# Patient Record
Sex: Female | Born: 1980 | Race: White | Hispanic: No | Marital: Married | Smoking: Never smoker
Health system: Southern US, Community
[De-identification: ages and names within clinical notes are randomized; demographics above are authoritative.]

## PROBLEM LIST (undated history)

## (undated) DIAGNOSIS — F32A Depression, unspecified: Secondary | ICD-10-CM

## (undated) DIAGNOSIS — R7309 Other abnormal glucose: Secondary | ICD-10-CM

## (undated) DIAGNOSIS — F329 Major depressive disorder, single episode, unspecified: Secondary | ICD-10-CM

## (undated) DIAGNOSIS — E669 Obesity, unspecified: Secondary | ICD-10-CM

## (undated) DIAGNOSIS — F419 Anxiety disorder, unspecified: Secondary | ICD-10-CM

## (undated) DIAGNOSIS — R569 Unspecified convulsions: Secondary | ICD-10-CM

## (undated) DIAGNOSIS — G4730 Sleep apnea, unspecified: Secondary | ICD-10-CM

## (undated) HISTORY — DX: Anxiety disorder, unspecified: F41.9

## (undated) HISTORY — DX: Major depressive disorder, single episode, unspecified: F32.9

## (undated) HISTORY — DX: Depression, unspecified: F32.A

## (undated) HISTORY — DX: Unspecified convulsions: R56.9

## (undated) HISTORY — DX: Obesity, unspecified: E66.9

## (undated) HISTORY — DX: Other abnormal glucose: R73.09

---

## 2006-11-12 ENCOUNTER — Ambulatory Visit: Payer: Self-pay

## 2007-04-26 ENCOUNTER — Observation Stay: Payer: Self-pay | Admitting: Obstetrics & Gynecology

## 2007-04-26 ENCOUNTER — Inpatient Hospital Stay: Payer: Self-pay | Admitting: Obstetrics & Gynecology

## 2011-04-10 ENCOUNTER — Ambulatory Visit: Payer: Self-pay | Admitting: Internal Medicine

## 2011-07-28 ENCOUNTER — Emergency Department: Payer: Self-pay | Admitting: Internal Medicine

## 2011-08-01 ENCOUNTER — Ambulatory Visit: Payer: Self-pay | Admitting: Gastroenterology

## 2012-05-18 ENCOUNTER — Ambulatory Visit: Payer: Self-pay | Admitting: Obstetrics and Gynecology

## 2012-05-28 ENCOUNTER — Ambulatory Visit: Payer: Self-pay | Admitting: Obstetrics and Gynecology

## 2012-06-22 DIAGNOSIS — O24419 Gestational diabetes mellitus in pregnancy, unspecified control: Secondary | ICD-10-CM | POA: Insufficient documentation

## 2012-06-28 ENCOUNTER — Ambulatory Visit: Payer: Self-pay | Admitting: Obstetrics and Gynecology

## 2012-07-02 ENCOUNTER — Encounter: Payer: Self-pay | Admitting: Obstetrics and Gynecology

## 2012-07-02 LAB — URINALYSIS, COMPLETE
Glucose,UR: NEGATIVE mg/dL (ref 0–75)
Ketone: NEGATIVE
Leukocyte Esterase: NEGATIVE
Nitrite: NEGATIVE
Protein: NEGATIVE
Specific Gravity: 1.004 (ref 1.003–1.030)
WBC UR: 1 /HPF (ref 0–5)

## 2012-07-15 ENCOUNTER — Emergency Department: Payer: Self-pay | Admitting: Emergency Medicine

## 2012-07-15 LAB — CBC
HGB: 11.4 g/dL — ABNORMAL LOW (ref 12.0–16.0)
MCH: 26.4 pg (ref 26.0–34.0)
MCV: 82 fL (ref 80–100)
Platelet: 273 10*3/uL (ref 150–440)
RBC: 4.33 10*6/uL (ref 3.80–5.20)
WBC: 11.5 10*3/uL — ABNORMAL HIGH (ref 3.6–11.0)

## 2012-07-15 LAB — BASIC METABOLIC PANEL
Anion Gap: 5 — ABNORMAL LOW (ref 7–16)
BUN: 7 mg/dL (ref 7–18)
Chloride: 107 mmol/L (ref 98–107)
Co2: 25 mmol/L (ref 21–32)
Osmolality: 271 (ref 275–301)
Potassium: 4.1 mmol/L (ref 3.5–5.1)

## 2012-07-15 LAB — URINALYSIS, COMPLETE
Blood: NEGATIVE
Glucose,UR: NEGATIVE mg/dL (ref 0–75)
Ketone: NEGATIVE
Nitrite: NEGATIVE
Protein: NEGATIVE
RBC,UR: <1 /HPF (ref 0–5)
Squamous Epithelial: <1
WBC UR: 1 /HPF (ref 0–5)

## 2012-07-16 ENCOUNTER — Encounter: Payer: Self-pay | Admitting: Obstetrics and Gynecology

## 2012-07-16 LAB — URINALYSIS, COMPLETE
Glucose,UR: NEGATIVE mg/dL (ref 0–75)
Ketone: NEGATIVE
Nitrite: NEGATIVE
Ph: 7 (ref 4.5–8.0)
Protein: 30
RBC,UR: 2 /HPF (ref 0–5)
Specific Gravity: 1.03 (ref 1.003–1.030)
WBC UR: 3 /HPF (ref 0–5)

## 2012-07-30 ENCOUNTER — Encounter: Payer: Self-pay | Admitting: Maternal & Fetal Medicine

## 2012-07-30 LAB — URINALYSIS, COMPLETE
Bacteria: NONE SEEN
Blood: NEGATIVE
Nitrite: NEGATIVE
Ph: 7 (ref 4.5–8.0)
Protein: NEGATIVE
Specific Gravity: 1.018 (ref 1.003–1.030)
WBC UR: 2 /HPF (ref 0–5)

## 2012-09-10 ENCOUNTER — Encounter: Payer: Self-pay | Admitting: Obstetrics & Gynecology

## 2012-09-10 LAB — URINALYSIS, COMPLETE
Bilirubin,UR: NEGATIVE
Glucose,UR: NEGATIVE mg/dL (ref 0–75)
Ph: 7 (ref 4.5–8.0)
Specific Gravity: 1.021 (ref 1.003–1.030)
WBC UR: 2 /HPF (ref 0–5)

## 2012-09-21 ENCOUNTER — Encounter: Payer: Self-pay | Admitting: Obstetrics & Gynecology

## 2012-09-22 DIAGNOSIS — O0990 Supervision of high risk pregnancy, unspecified, unspecified trimester: Secondary | ICD-10-CM | POA: Insufficient documentation

## 2012-09-28 ENCOUNTER — Encounter: Payer: Self-pay | Admitting: Obstetrics and Gynecology

## 2012-10-01 ENCOUNTER — Encounter: Payer: Self-pay | Admitting: Obstetrics & Gynecology

## 2012-10-08 ENCOUNTER — Encounter: Payer: Self-pay | Admitting: Obstetrics and Gynecology

## 2012-10-09 DIAGNOSIS — A491 Streptococcal infection, unspecified site: Secondary | ICD-10-CM | POA: Insufficient documentation

## 2012-10-15 ENCOUNTER — Encounter: Payer: Self-pay | Admitting: Obstetrics & Gynecology

## 2012-10-19 ENCOUNTER — Encounter: Payer: Self-pay | Admitting: Obstetrics & Gynecology

## 2012-10-20 DIAGNOSIS — Z3009 Encounter for other general counseling and advice on contraception: Secondary | ICD-10-CM | POA: Insufficient documentation

## 2012-10-26 ENCOUNTER — Encounter: Payer: Self-pay | Admitting: Maternal & Fetal Medicine

## 2013-06-19 ENCOUNTER — Emergency Department: Payer: Self-pay | Admitting: Emergency Medicine

## 2013-06-19 LAB — LIPASE, BLOOD: Lipase: 151 U/L (ref 73–393)

## 2013-06-19 LAB — URINALYSIS, COMPLETE
Bacteria: NONE SEEN
Glucose,UR: NEGATIVE mg/dL (ref 0–75)
Nitrite: NEGATIVE
Ph: 6 (ref 4.5–8.0)
Protein: NEGATIVE
RBC,UR: 1 /HPF (ref 0–5)
Squamous Epithelial: 2

## 2013-06-19 LAB — CBC
HCT: 40.1 % (ref 35.0–47.0)
HGB: 13.5 g/dL (ref 12.0–16.0)
MCV: 78 fL — ABNORMAL LOW (ref 80–100)
Platelet: 284 10*3/uL (ref 150–440)
RDW: 16.4 % — ABNORMAL HIGH (ref 11.5–14.5)
WBC: 9.4 10*3/uL (ref 3.6–11.0)

## 2013-06-19 LAB — COMPREHENSIVE METABOLIC PANEL
Anion Gap: 4 — ABNORMAL LOW (ref 7–16)
Calcium, Total: 9 mg/dL (ref 8.5–10.1)
Chloride: 106 mmol/L (ref 98–107)
Co2: 27 mmol/L (ref 21–32)
Creatinine: 1.01 mg/dL (ref 0.60–1.30)
Glucose: 98 mg/dL (ref 65–99)
Potassium: 3.7 mmol/L (ref 3.5–5.1)
SGOT(AST): 27 U/L (ref 15–37)
SGPT (ALT): 49 U/L (ref 12–78)
Sodium: 137 mmol/L (ref 136–145)

## 2014-02-23 ENCOUNTER — Ambulatory Visit: Payer: Self-pay

## 2014-07-20 ENCOUNTER — Emergency Department: Payer: Self-pay | Admitting: Emergency Medicine

## 2014-07-20 LAB — URINALYSIS, COMPLETE
BILIRUBIN, UR: NEGATIVE
Blood: NEGATIVE
GLUCOSE, UR: NEGATIVE mg/dL (ref 0–75)
Ketone: NEGATIVE
Nitrite: NEGATIVE
PH: 5 (ref 4.5–8.0)
Protein: NEGATIVE
RBC,UR: 3 /HPF (ref 0–5)
SPECIFIC GRAVITY: 1.018 (ref 1.003–1.030)

## 2014-07-20 LAB — CBC WITH DIFFERENTIAL/PLATELET
Basophil #: 0 10*3/uL (ref 0.0–0.1)
Basophil %: 0.5 %
Eosinophil #: 0.2 10*3/uL (ref 0.0–0.7)
Eosinophil %: 2.7 %
HCT: 38.8 % (ref 35.0–47.0)
HGB: 12.3 g/dL (ref 12.0–16.0)
Lymphocyte #: 2.3 10*3/uL (ref 1.0–3.6)
Lymphocyte %: 25 %
MCH: 25.3 pg — AB (ref 26.0–34.0)
MCHC: 31.8 g/dL — ABNORMAL LOW (ref 32.0–36.0)
MCV: 79 fL — AB (ref 80–100)
Monocyte #: 0.5 x10 3/mm (ref 0.2–0.9)
Monocyte %: 5.9 %
NEUTROS ABS: 6 10*3/uL (ref 1.4–6.5)
Neutrophil %: 65.9 %
PLATELETS: 290 10*3/uL (ref 150–440)
RBC: 4.89 10*6/uL (ref 3.80–5.20)
RDW: 16.3 % — AB (ref 11.5–14.5)
WBC: 9 10*3/uL (ref 3.6–11.0)

## 2014-07-20 LAB — COMPREHENSIVE METABOLIC PANEL
ALK PHOS: 69 U/L
ALT: 48 U/L
Albumin: 3.2 g/dL — ABNORMAL LOW (ref 3.4–5.0)
Anion Gap: 6 — ABNORMAL LOW (ref 7–16)
BUN: 16 mg/dL (ref 7–18)
Bilirubin,Total: 0.2 mg/dL (ref 0.2–1.0)
Calcium, Total: 8.2 mg/dL — ABNORMAL LOW (ref 8.5–10.1)
Chloride: 107 mmol/L (ref 98–107)
Co2: 27 mmol/L (ref 21–32)
Creatinine: 1.13 mg/dL (ref 0.60–1.30)
EGFR (African American): >60
EGFR (Non-African Amer.): 59 — ABNORMAL LOW
GLUCOSE: 144 mg/dL — AB (ref 65–99)
Osmolality: 283 (ref 275–301)
Potassium: 4.1 mmol/L (ref 3.5–5.1)
SGOT(AST): 30 U/L (ref 15–37)
Sodium: 140 mmol/L (ref 136–145)
Total Protein: 7.5 g/dL (ref 6.4–8.2)

## 2014-07-20 LAB — LIPASE, BLOOD: LIPASE: 139 U/L (ref 73–393)

## 2015-01-27 ENCOUNTER — Ambulatory Visit
Admit: 2015-01-27 | Disposition: A | Discharge status: Home / Self Care | Payer: Self-pay | Attending: Specialist | Admitting: Specialist

## 2015-02-14 NOTE — Consult Note (Signed)
Referral Information:   Reason for Referral Gest Diabetes Increased BMI, plans [facility]    Referring Physician [facility]    Prenatal Hx 34 yo G3 p0111 at 20 3/7 weeks LMP unsure dated by 04/10/2012 u/s giving an EDC of 11/13/2012  following problems - abnormal early GCT 195 - Gest diabetes seen at lifestyle center [facility] , hgb A1c 5.6 -increased BMI 51- she has had weight loss of about 24 lbs with diet modification -prior delivery at 35 weeks    Past Obstetrical Hx 2008 spontaneous vaginal delivery at 35 weeks- pt thinks she was 37 weeks , no neonatal issues except increased bili , 7 lbs female  Sab   Home Medications: Medication Instructions Status  Bayer Aspirin  81 milligram(s) orally once a day Active  Flintstone chewable gummy 1  orally once a day Active   Allergies:   PCN: Rash  Vital Signs/Notes:  Nursing Vital Signs: **Vital Signs.:   05-Sep-13 13:10   Vital Signs Type Routine   Temperature Temperature (F) 97.4   Celsius 36.3   Temperature Source oral   Pulse Pulse 94   Pulse source if not from Vital Sign Device dinamap   Respirations Respirations 16   Systolic BP Systolic BP 119   Diastolic BP (mmHg) Diastolic BP (mmHg) 61   Mean BP 80   BP Source  if not from Vital Sign Device dinamap   Perinatal Consult:   LMP 03-Feb-2012    Past Medical History cont'd elevated BMI - [facility]    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   Review Of Systems:   Tolerating Diet Yes  hungry     Medications/Allergies Reviewed Medications/Allergies reviewed      Routine UA:  05-Sep-13 12:25    Color (UA) Straw   Clarity (UA) Clear   Glucose (UA) Negative   Bilirubin (UA) Negative   Ketones (UA) Negative   Specific Gravity (UA) 1.004   Blood (UA) Negative   Protein (UA) Negative   Nitrite (UA) Negative   Leukocyte Esterase (UA) Negative (Result(s) reported on 02 Jul 2012 at 01:30PM.)   RBC (UA) 1 /HPF   WBC (UA) 1 /HPF   Bacteria  (UA) TRACE   Epithelial Cells (UA) 1 /HPF (Result(s) reported on 02 Jul 2012 at 01:30PM.)     Additional Lab/Radiology Notes FBS 87-124 (7/8 >100) 2h breakfast 85-142 (4/8 >120) 2 h lunch 86-170 (3/6 >120) 2 h supper126-185 (4/4 >120)   Impression/Recommendations:   Impression IUP at 20 3/7 weeks - [facility] today  Gestational diabetes elevated sugars  Increased BMI weight loss    Recommendations start glyburide 2.5 bid  continue diet may need additional calories if weight loss continues  f/u 1 week to review sugars - pt would like to fax to avoid missing work- if improving reasonable to do f/u growth in 4 weeks   Plan:   Ultrasound at what gestational ages Monthly > 28 weeks    Antepartum Testing Twice weekly, Starting at 32 weeks    Delivery Mode Vaginal    Delivery at what gestational age [redacted] weeks     Total Time Spent with Patient 15 minutes    >50% of visit spent in couseling/coordination of care yes    Office Use Only 99241  Level 1 (15min) NEW office consult prob focused   Coding Description: MATERNAL CONDITIONS/HISTORY INDICATION(S).   Diabetes - Gestational GDM.  Electronic Signatures: Jimmey RalphLivingston, Tami Barren  Neysa Bonito (MD)  (Signed 05-Sep-13 14:34)  Authored: Referral, Home Medications, Allergies, Vital Signs/Notes, Consult, Exam, Lab, Lab/Radiology Notes, Impression, Plan, Billing, Coding Description   Last Updated: 05-Sep-13 14:34 by Rondall Allegra (MD)

## 2015-02-14 NOTE — Consult Note (Signed)
Referral Information:   Reason for Referral Gest Diabetes Increased BMI, plans [facility]    Referring Physician [facility] ([facility]    Prenatal Hx 34 yo G3 p0111 at 22 3/7 weeks LMP unsure dated by 04/10/2012 u/s giving an EDC of 11/13/2012  following problems - Gestational diabetes-abnormal early GCT 195 - [facility] on 1800 KCal ADA diet  , hgb A1c 5.6- started glyburide last visit 2.5 bid - one low  -increased BMI 51- she has had weight loss of about 24 lbs with diet modification- stable now  -prior delivery at 35 weeks -seen in ER yesterday for back spasm    Past Obstetrical Hx 2008 spontaneous vaginal delivery at 35 weeks- pt thinks she was 37 weeks , no neonatal issues except increased bili , 7 lbs female  Sab   Home Medications:  Medication Instructions Status  glyBURIDE 2.5 mg oral tablet 1 tab(s) orally 2 times a day Active  aspirin 81 mg oral tablet 1 tab(s) orally once a day Active   Allergies:   PCN: Rash  Vital Signs/Notes:  Nursing Vital Signs:  **Vital Signs.:   19-Sep-13 14:29   Vital Signs Type Routine   Pulse Pulse 105   Pulse source if not from Vital Sign Device dinamap   Respirations Respirations 14   Systolic BP Systolic BP 112   Diastolic BP (mmHg) Diastolic BP (mmHg) 57   Mean BP 75   BP Source  if not from Vital Sign Device dinamap   Perinatal Consult:   LMP 03-Feb-2012    Past Medical History cont'd elevated BMI - [id number]    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   Review Of Systems:   Subjective back pain/spasm- no clear injury  recent butock rash spontaneously resolved    Tolerating Diet Yes  hungry    Medications/Allergies Reviewed Medications/Allergies reviewed   Exam:   Today's Weight 326     Routine UA:  19-Sep-13 08:01    Color (UA) Yellow   Clarity (UA) Hazy   Glucose (UA) Negative   Bilirubin (UA) 1+   Ketones (UA) Negative   Specific Gravity (UA)  1.030   Blood (UA) Negative   pH (UA) 7.0   Protein (UA) 30 mg/dL   Nitrite (UA) Negative   Leukocyte Esterase (UA) Trace (Result(s) reported on 16 Jul 2012 at 02:28PM.)   RBC (UA) 2 /HPF   WBC (UA) 3 /HPF   Bacteria (UA) TRACE   Epithelial Cells (UA) 3 /HPF   Mucous (UA) PRESENT (Result(s) reported on 16 Jul 2012 at 02:28PM.)     Additional Lab/Radiology Notes FBS 98-171 (4/7 >100) 171 may be a rebound from low 2h breakfast 93-146 (2/7 >120) 2 h lunch 93-106 (0/4 >120) 2 h supper 58-166 (2/4 >120)   Impression/Recommendations:   Impression IUP at 22 3/7 weeks - [facility]  Gestational diabetes recently started glyburide Increased BMI weight loss Back spasms - [facility] no evidence of stone or infection buttock rash resolved    Recommendations increase  glyburide 5/ 2.5  if more lows drop back to 2.5 continue diet may need additional calories if weight loss continues  f/u 2 week to review sugars - pt would like to fax to avoid missing work- if improving reasonable to do f/u growth in 10/3 Rx flexeril for back at night tylenol ok during day- offered PT  Plan:   Ultrasound at what gestational ages Monthly > 28 weeks    Antepartum Testing Twice weekly, Starting at 17 weeks    Delivery Mode Vaginal    Delivery at what gestational age [redacted] weeks     Total Time Spent with Patient 15 minutes    >50% of visit spent in couseling/coordination of care yes    Office Use Only [id number]  Office Visit Level 3 ( ) EST exp prob focused outpt   Coding Description: MATERNAL CONDITIONS/HISTORY INDICATION(S).   Diabetes - Gestational GDM.  Electronic Signatures: Rondall Allegra (MD)  (Signed 19-Sep-13 15:56)  Authored: Referral, Home Medications, Allergies, Vital Signs/Notes, Consult, Exam, Lab, Lab/Radiology Notes, Impression, Plan, Billing, Coding Description   Last Updated: 19-Sep-13 15:56 by Rondall Allegra (MD)

## 2015-02-14 NOTE — Consult Note (Signed)
Referral Information:   Reason for Referral Review sugar log    Referring Physician [facility]    Prenatal Hx Audrey Wolfe is a 34 year-old G3 P0111 at 28 3/7 weeks with gestational diabetes who presents to review her sugar log. She is receiving her prenanatal care with our group in [place]. She is taking glyburide 2.5 mg twice daily.  She did not bring in her log or her meter. She states that her fasting and 2hr post-prandial values are all "normal". She reports excellent fetal movement and denies leakage of fluid, contractions, pressure or vaginal bleeding.   She had a growth ultrasound at the [facility] at [facility] on 10/29/11 that demonstrated an EFW of 1615 g (90%) with the [facility] measuring one week ahead and the AFI 19.7 (MVP 6.9).  This am she had an apple and 3 mini muffins. Her BG here was 130 less than 2 hours since eating.   Home Medications: Medication Instructions Status  glyBURIDE 2.5 mg oral tablet 1 tab(s) orally 2 times a day Active  aspirin 81 mg oral tablet 1 tab(s) orally once a day Active   Allergies:   PCN: Rash  Vital Signs/Notes:  Nursing Vital Signs: **Vital Signs.:   14-Nov-13 08:56   Systolic BP Systolic BP 119   Diastolic BP (mmHg) Diastolic BP (mmHg) 60     Routine UA:  05-Sep-13 12:25    Ketones (UA) Negative   Protein (UA) Negative  19-Sep-13 08:01    Ketones (UA) Negative   Protein (UA) 30 mg/dL  29-FAO-13 08:65    Ketones (UA) Trace   Protein (UA) Negative  14-Nov-13 09:32    Color (UA) Yellow   Clarity (UA) Hazy   Glucose (UA) Negative   Ketones (UA) Trace   Specific Gravity (UA) 1.021   Blood (UA) Negative   pH (UA) 7.0   Protein (UA) Negative   Nitrite (UA) Negative   Leukocyte Esterase (UA) Trace (Result(s) reported on 10 Sep 2012 at 10:04AM.)   RBC (UA) 3 /HPF   WBC (UA) 2 /HPF   Bacteria (UA) TRACE   Epithelial Cells (UA) 1 /HPF   Mucous (UA) PRESENT (Result(s) reported on 10 Sep 2012 at 10:04AM.)    Impression/Recommendations:   Impression 34 year-old G3 P0111 at 63 3/7 weeks with GDM on glyburide. She is currently taking 2.5 mg twice daily. There is no glucose in her urine and her BP is normal. She reports excellent fetal movement.    Recommendations I explained the importance of following her diet, taking her medications and recording sugars. I explained that without bringing her sugar log or meter that we are unable to make adjustments or determine if her control is good.  She has trasnferred her care to [facility] and is planning on delivering at [facility]. I explained that we can do her ultrasounds and fetal testing in the [place] office but need do her prenatal visits in our [facility] office.  I scheduled the following appts for her: -Prenatal visit with Dr. Idamae Schuller, Friday Nov 22 8:10 am at [facility] -NST/AFI at [facility] in [place] on Friday Nov 22 -NST at [facility] on Monday Nov 25 -Growth scan in [place] on Dec 5.  We discussed preterm labor precautions, blood sugar values in which to contact us and kick counts. I answered all of her questions.     Total Time Spent with Patient 15 minutes    >50% of visit spent in couseling/coordination of care yes  Office Use Only [id number]  Office Visit Level 3 (15min) EST exp prob focused outpt   Coding Description: MATERNAL CONDITIONS/HISTORY INDICATION(S).   Diabetes - Gestational GDM.  Electronic Signatures: Jumaane Weatherford, [place] (MD)  (Signed [phone]-Nov-13 12:16)  Authored: Referral, Home Medications, Allergies, Vital Signs/Notes, Lab, Impression, Billing, Coding Description   Last Updated: 14-Nov-13 12:16 by Ramsha Lonigro, [place] (MD)

## 2015-05-11 ENCOUNTER — Ambulatory Visit (INDEPENDENT_AMBULATORY_CARE_PROVIDER_SITE_OTHER): Payer: 59

## 2015-05-11 DIAGNOSIS — Z308 Encounter for other contraceptive management: Secondary | ICD-10-CM | POA: Diagnosis not present

## 2015-05-11 DIAGNOSIS — Z3042 Encounter for surveillance of injectable contraceptive: Secondary | ICD-10-CM

## 2015-05-11 MED ORDER — MEDROXYPROGESTERONE ACETATE 150 MG/ML IM SUSP
150.0000 mg | Freq: Once | INTRAMUSCULAR | Status: AC
  Administered 2015-05-11: 150 mg via INTRAMUSCULAR

## 2015-08-10 ENCOUNTER — Ambulatory Visit (INDEPENDENT_AMBULATORY_CARE_PROVIDER_SITE_OTHER): Payer: 59

## 2015-08-10 DIAGNOSIS — Z308 Encounter for other contraceptive management: Secondary | ICD-10-CM | POA: Diagnosis not present

## 2015-08-10 MED ORDER — MEDROXYPROGESTERONE ACETATE 150 MG/ML IM SUSP
150.0000 mg | Freq: Once | INTRAMUSCULAR | Status: AC
  Administered 2015-08-10: 150 mg via INTRAMUSCULAR

## 2015-08-11 ENCOUNTER — Emergency Department
Admission: EM | Admit: 2015-08-11 | Discharge: 2015-08-11 | Disposition: A | Discharge status: Home / Self Care | Payer: 59 | Attending: Emergency Medicine | Admitting: Emergency Medicine

## 2015-08-11 ENCOUNTER — Encounter: Payer: Self-pay | Admitting: *Deleted

## 2015-08-11 ENCOUNTER — Emergency Department: Payer: 59

## 2015-08-11 DIAGNOSIS — Y998 Other external cause status: Secondary | ICD-10-CM | POA: Insufficient documentation

## 2015-08-11 DIAGNOSIS — Z880 Allergy status to penicillin: Secondary | ICD-10-CM | POA: Diagnosis not present

## 2015-08-11 DIAGNOSIS — X58XXXA Exposure to other specified factors, initial encounter: Secondary | ICD-10-CM | POA: Insufficient documentation

## 2015-08-11 DIAGNOSIS — Y9289 Other specified places as the place of occurrence of the external cause: Secondary | ICD-10-CM | POA: Insufficient documentation

## 2015-08-11 DIAGNOSIS — S8992XA Unspecified injury of left lower leg, initial encounter: Secondary | ICD-10-CM | POA: Diagnosis not present

## 2015-08-11 DIAGNOSIS — M25562 Pain in left knee: Secondary | ICD-10-CM

## 2015-08-11 DIAGNOSIS — Y9301 Activity, walking, marching and hiking: Secondary | ICD-10-CM | POA: Insufficient documentation

## 2015-08-11 MED ORDER — KETOROLAC TROMETHAMINE 60 MG/2ML IM SOLN
INTRAMUSCULAR | Status: AC
  Administered 2015-08-11: 60 mg
  Filled 2015-08-11: qty 2

## 2015-08-11 MED ORDER — OXYCODONE-ACETAMINOPHEN 5-325 MG PO TABS: 1.0000 | ORAL_TABLET | Freq: Four times a day (QID) | ORAL | Status: DC | PRN

## 2015-08-11 MED ORDER — ACETAMINOPHEN 500 MG PO TABS
1000.0000 mg | ORAL_TABLET | Freq: Once | ORAL | Status: AC
  Administered 2015-08-11: 1000 mg via ORAL

## 2015-08-11 MED ORDER — IBUPROFEN 800 MG PO TABS: 800.0000 mg | ORAL_TABLET | Freq: Three times a day (TID) | ORAL | Status: DC | PRN

## 2015-08-11 MED ORDER — ACETAMINOPHEN 500 MG PO TABS
ORAL_TABLET | ORAL | Status: AC
  Filled 2015-08-11: qty 2

## 2015-08-11 MED ORDER — KETOROLAC TROMETHAMINE 30 MG/ML IJ SOLN: 60.0000 mg | Freq: Once | INTRAMUSCULAR | Status: AC

## 2015-08-11 NOTE — ED Notes (Signed)
Pt walking today and heard pop in left knee.

## 2015-08-11 NOTE — ED Notes (Signed)
Pt presents with left knee pain. States she was walking and heard a pop. Denies fall. Denies ankle or hip pain.

## 2015-08-11 NOTE — Discharge Instructions (Signed)
Today you do not have a fracture or dislocation. It is still possible that you may have other injury to your knee, such as tendon, ligament, or meniscus injury. Please use the knee immobilizer as needed to support the knee when you're walking. Please make sure the remove the knee immobilizer at least once an hour and fully range your knee to prevent soreness and stiffness. You may take Motrin for mild to moderate pain, and Percocet for severe pain. Do not drive within 8 hours of taking Percocet or take care of your children by yourself when you're taking Percocet.  Please return to the emergency department if you develop severe pain, numbness tingling or weakness, fever, vomiting, or any other symptoms concerning to you.  How to Use a Knee Brace A knee brace is a device that you wear to support your knee, especially if the knee is healing after an injury or surgery. There are several types of knee braces. Some are designed to prevent an injury (prophylactic brace). These are often worn during sports. Others support an injured knee (functional brace) or keep it still while it heals (rehabilitative brace). People with severe arthritis of the knee may benefit from a brace that takes some pressure off the knee (unloader brace). Most knee braces are made from a combination of cloth and metal or plastic.  You may need to wear a knee brace to:  Relieve knee pain.  Help your knee support your weight (improve stability).  Help you walk farther (improve mobility).  Prevent injury.  Support your knee while it heals from surgery or from an injury. RISKS AND COMPLICATIONS Generally, knee braces are very safe to wear. However, problems may occur, including:  Skin irritation that may lead to infection.  Making your condition worse if you wear the brace in the wrong way. HOW TO USE A KNEE BRACE Different braces will have different instructions for use. Your health care provider will tell you or show  you:  How to put on your brace.  How to adjust the brace.  When and how often to wear the brace.  How to remove the brace.  If you will need any assistive devices in addition to the brace, such as crutches or a cane. In general, your brace should:  Have the hinge of the brace line up with the bend of your knee.  Have straps, hooks, or tapes that fasten snugly around your leg.  Not feel too tight or too loose. HOW TO CARE FOR A KNEE BRACE  Check your brace often for signs of damage, such as loose connections or attachments. Your knee brace may get damaged or wear out during normal use.  Wash the fabric parts of your brace with soap and water.  Read the insert that comes with your brace for other specific care instructions. SEEK MEDICAL CARE IF:  Your knee brace is too loose or too tight and you cannot adjust it.  Your knee brace causes skin redness, swelling, bruising, or irritation.  Your knee brace is not helping.  Your knee brace is making your knee pain worse.   This information is not intended to replace advice given to you by your health care provider. Make sure you discuss any questions you have with your health care provider.   Document Released: 01/04/2004 Document Revised: 07/05/2015 Document Reviewed: 02/06/2015 Elsevier Interactive Patient Education [employer]2016 Elsevier Inc.

## 2015-08-11 NOTE — ED Provider Notes (Signed)
[facility] Emergency Department Provider Note  ____________________________________________  Time seen: Approximately 9:24 PM  I have reviewed the triage vital signs and the nursing notes.   HISTORY  Chief Complaint Knee Pain    HPI AREEBA SULSER is a 34 y.o. female with morbid obesity presenting with left knee pain. Patient states that she was walking earlier today and "felt a pop." She did not fall and was able to continue bearing weight. She now feels like she needs to walk with a limp. Pain is worse if she bends her knee. She denies any fever, vaginal discharge, numbness tingling or weakness.   History reviewed. No pertinent past medical history.  There are no active problems to display for this patient.   History reviewed. No pertinent past surgical history.  Current Outpatient Rx  Name  Route  Sig  Dispense  Refill  . ibuprofen (ADVIL,MOTRIN) 800 MG tablet   Oral   Take 1 tablet (800 mg total) by mouth every 8 (eight) hours as needed for moderate pain (with food).   20 tablet   0   . oxyCODONE-acetaminophen (ROXICET) 5-325 MG tablet   Oral   Take 1 tablet by mouth every 6 (six) hours as needed for severe pain.   12 tablet   0     Allergies Penicillins  No family history on file.  Social History Social History  Substance Use Topics  . Smoking status: Never Smoker   . Smokeless tobacco: None  . Alcohol Use: No    Review of Systems Constitutional: No fever/chills. No syncope. Eyes: No visual changes. ENT: No sore throat. Cardiovascular: Denies chest pain, palpitations. Respiratory: Denies shortness of breath.  No cough. Gastrointestinal: No abdominal pain.  No nausea, no vomiting.  No diarrhea.  No constipation.  Musculoskeletal: Negative for back pain. Positive for left knee pain. Skin: Negative for rash. Neurological: Negative for headaches, focal weakness or numbness.  10-point ROS otherwise  negative.  ____________________________________________   PHYSICAL EXAM:  VITAL SIGNS: ED Triage Vitals  Enc Vitals Group     BP 08/11/15 2105 142/81 mmHg     Pulse Rate 08/11/15 2105 98     Resp 08/11/15 2105 16     Temp 08/11/15 2105 98.3 F (36.8 C)     Temp Source 08/11/15 2105 Oral     SpO2 08/11/15 2105 95 %     Weight 08/11/15 2105 365 lb (165.563 kg)     Height 08/11/15 2105  (1.702 m)     Head Cir --      Peak Flow --      Pain Score 08/11/15 2103 10     Pain Loc --      Pain Edu? --      Excl. in GC? --     Constitutional: Alert and oriented. Well appearing and in no acute distress. Answer question appropriately. Eyes: Conjunctivae are normal.  EOMI. Head: Atraumatic. Nose: No congestion/rhinnorhea. Mouth/Throat: Mucous membranes are moist.  Neck: No stridor.  Supple.   Cardiovascular: Normal rate, regular rhythm. No murmurs, rubs or gallops.  Respiratory: Normal respiratory effort.  No retractions. Lungs CTAB.  No wheezes, rales or ronchi. Musculoskeletal: No LE edema. Full range of motion of the left ankle and hip without pain. Patient has full range of motion of the left knee but pain when moving it. No obvious effusion. Patella is midline. Normal DP and PT pulse. Normal sensation to light touch. Normal cap refill of less than  2 seconds. Neurologic:  Normal speech and language. No gross focal neurologic deficits are appreciated.  Skin:  Skin is warm, dry and intact. No rash noted. Psychiatric: Mood and affect are normal. Speech and behavior are normal.  Normal judgement.  ____________________________________________   LABS (all labs ordered are listed, but only abnormal results are displayed)  Labs Reviewed - No data to display ____________________________________________  EKG  Not indicated ____________________________________________  RADIOLOGY  Dg Knee Complete 4 Views Left  08/11/2015  CLINICAL DATA:  Left knee pain EXAM: LEFT KNEE -  COMPLETE 4+ VIEW COMPARISON:  None. FINDINGS: Four views of the left knee submitted. No acute fracture or subluxation. Mild narrowing of medial joint compartment. Mild spurring of medial tibial plateau. Narrowing of patellofemoral joint space. Trace joint effusion. IMPRESSION: No acute fracture or subluxation.  Mild degenerative changes. Electronically Signed   By: Natasha MeadLiviu  Pop M.D.   On: 08/11/2015 21:44    ____________________________________________   PROCEDURES  Procedure(s) performed: None  Critical Care performed: No ____________________________________________   INITIAL IMPRESSION / ASSESSMENT AND PLAN / ED COURSE  Pertinent labs & imaging results that were available during my care of the patient were reviewed by me and considered in my medical decision making (see chart for details).  34 y.o. female with morbid obesity presenting with acute onset of left knee pain. On my exam, the patient is not showing evidence of dislocation. She does not have acute trauma so fracture is unlikely but I will get an x-ray to confirm. It is possible that she has ligamentous, tendon, or meniscal injury. Consider also ruptured Baker cyst. I do not see evidence of septic arthritis or gout in the knee.  ----------------------------------------- 10:05 PM on 08/11/2015 -----------------------------------------  The patient's knee x-ray does not show any acute injury. Her pain has improved. I will place a knee immobilizer on her, give her crutches and medications for symptomatic treatment as well as instructions for [place]. She will follow-up with orthopedist on-call. Plan discharge. She understands return precautions as well as follow-up instructions.  ____________________________________________  FINAL CLINICAL IMPRESSION(S) / ED DIAGNOSES  Final diagnoses:  Knee pain, left      NEW MEDICATIONS STARTED DURING THIS VISIT:  New Prescriptions   IBUPROFEN (ADVIL,MOTRIN) 800 MG TABLET    Take 1  tablet (800 mg total) by mouth every 8 (eight) hours as needed for moderate pain (with food).   OXYCODONE-ACETAMINOPHEN (ROXICET) 5-325 MG TABLET    Take 1 tablet by mouth every 6 (six) hours as needed for severe pain.     Rockne MenghiniAnne-Caroline Somalia Segler, MD 08/11/15 2205

## 2015-09-12 ENCOUNTER — Telehealth: Payer: Self-pay

## 2015-09-12 DIAGNOSIS — R7309 Other abnormal glucose: Secondary | ICD-10-CM | POA: Insufficient documentation

## 2015-09-12 DIAGNOSIS — F419 Anxiety disorder, unspecified: Principal | ICD-10-CM

## 2015-09-12 DIAGNOSIS — F329 Major depressive disorder, single episode, unspecified: Secondary | ICD-10-CM

## 2015-09-12 DIAGNOSIS — R569 Unspecified convulsions: Secondary | ICD-10-CM | POA: Insufficient documentation

## 2015-09-12 DIAGNOSIS — F32A Depression, unspecified: Secondary | ICD-10-CM | POA: Insufficient documentation

## 2015-09-12 NOTE — Telephone Encounter (Signed)
Called patient because we got a fax requesting office notes for the patient to get her cpap. Since we have not seen this patient for that, Elnita MaxwellCheryl stated that the patient needed an appointment. So I called and scheduled the patient an appointment for 09/18/15.

## 2015-09-18 ENCOUNTER — Ambulatory Visit: Payer: Self-pay | Admitting: Unknown Physician Specialty

## 2015-09-19 ENCOUNTER — Encounter: Payer: Self-pay | Admitting: Unknown Physician Specialty

## 2015-09-19 ENCOUNTER — Ambulatory Visit (INDEPENDENT_AMBULATORY_CARE_PROVIDER_SITE_OTHER): Payer: 59 | Admitting: Unknown Physician Specialty

## 2015-09-19 VITALS — BP 112/76 | HR 89 | Temp 98.4°F | Ht 65.5 in | Wt 360.4 lb

## 2015-09-19 DIAGNOSIS — G4730 Sleep apnea, unspecified: Secondary | ICD-10-CM | POA: Diagnosis not present

## 2015-09-19 DIAGNOSIS — E669 Obesity, unspecified: Secondary | ICD-10-CM | POA: Insufficient documentation

## 2015-09-19 NOTE — Progress Notes (Signed)
BP 112/76 mmHg  Pulse 89  Temp(Src) 98.4 F (36.9 C)  Ht 5' 5.5" (1.664 m)  Wt 360 lb 6.4 oz (163.476 kg)  BMI 59.04 kg/m2  SpO2 97%  LMP  (LMP Unknown)   Subjective:    Patient ID: Audrey Wolfe, female    DOB: [birth year], 34 y.o.   MRN: [id number]  HPI: Audrey Wolfe is a 34 y.o. female  Chief Complaint  Patient presents with  . other    got a fax requesting that we send a office visit note about sleep apnea for the patient    Planning on getting bariatric surgery. As part of the work-up, she got a sleep study.  She went to feeling great and diagnosed with sleep apnea.  She received a CPAP In October.  She states the settings is 12 with a nose/mask device.  Before the mask, she fell asleep during the day, snored at night, woke up with a headache.  No problems with BP.  Weight loss surgery is scheduled December 6th.  She has lost 14 pounds  Relevant past medical, surgical, family and social history reviewed and updated as indicated. Interim medical history since our last visit reviewed. Allergies and medications reviewed and updated.  Review of Systems  Per HPI unless specifically indicated above     Objective:    BP 112/76 mmHg  Pulse 89  Temp(Src) 98.4 F (36.9 C)  Ht 5' 5.5" (1.664 m)  Wt 360 lb 6.4 oz (163.476 kg)  BMI 59.04 kg/m2  SpO2 97%  LMP  (LMP Unknown)  Wt Readings from Last 3 Encounters:  09/19/15 360 lb 6.4 oz (163.476 kg)  05/20/14 354 lb (160.573 kg)  08/11/15 365 lb (165.563 kg)    Physical Exam  Constitutional: She is oriented to person, place, and time. She appears well-developed and well-nourished. No distress.  HENT:  Head: Normocephalic and atraumatic.  Eyes: Conjunctivae and lids are normal. Right eye exhibits no discharge. Left eye exhibits no discharge. No scleral icterus.  Cardiovascular: Normal rate, regular rhythm and normal heart sounds.   Pulmonary/Chest: Effort normal and breath sounds normal. No respiratory distress.   Abdominal: Normal appearance. There is no splenomegaly or hepatomegaly.  Musculoskeletal: Normal range of motion.  Neurological: She is alert and oriented to person, place, and time.  Skin: Skin is intact. No rash noted. No pallor.  Psychiatric: She has a normal mood and affect. Her behavior is normal. Judgment and thought content normal.    Results for orders placed or performed in visit on 07/20/14  Urinalysis, Complete  Result Value Ref Range   Color - urine Yellow    Clarity - urine Hazy    Glucose,UR Negative 0-75 mg/dL   Bilirubin,UR Negative NEGATIVE   Ketone Negative NEGATIVE   Specific Gravity 1.018 1.003-1.030   Blood Negative NEGATIVE   Ph 5.0 4.5-8.0   Protein Negative NEGATIVE   Nitrite Negative NEGATIVE   Leukocyte Esterase 1+ NEGATIVE   RBC,UR 3 /HPF 0-5 /HPF   WBC UR 17 /HPF 0-5 /HPF   Bacteria TRACE NONE SEEN   Squamous Epithelial 6 /HPF    Mucous PRESENT   CBC with Differential/Platelet  Result Value Ref Range   WBC 9.0 3.6-11.0 x10 3/mm 3   RBC 4.89 3.80-5.20 X10 6/mm 3   HGB 12.3 12.0-16.0 g/dL   HCT 38.8 35.0-47.0 %   MCV 79 (L) 80-100 fL   MCH 25.3 (L) 26.0-34.0 pg   MCHC 31.8 (L) 32.0-36.0 g/dL  RDW 16.3 (H) 11.5-14.5 %   Platelet 290 150-440 x10 3/mm 3   Neutrophil % 65.9 %   Lymphocyte % 25.0 %   Monocyte % 5.9 %   Eosinophil % 2.7 %   Basophil % 0.5 %   Neutrophil # 6.0 1.4-6.5 x10 3/mm 3   Lymphocyte # 2.3 1.0-3.6 x10 3/mm 3   Monocyte # 0.5 0.2-0.9 x10 3/mm    Eosinophil # 0.2 0.0-0.7 x10 3/mm 3   Basophil # 0.0 0.0-0.1 x10 3/mm 3  Comprehensive metabolic panel  Result Value Ref Range   Glucose 144 (H) 65-99 mg/dL   BUN 16 7-18 mg/dL   Creatinine 1.13 0.60-1.30 mg/dL   Sodium 140 136-145 mmol/L   Potassium 4.1 3.5-5.1 mmol/L   Chloride 107 98-107 mmol/L   Co2 27 21-32 mmol/L   Calcium, Total 8.2 (L) 8.5-10.1 mg/dL   SGOT(AST) 30 15-37 Unit/L   SGPT (ALT) 48 U/L   Alkaline Phosphatase 69 Unit/L   Albumin 3.2 (L) 3.4-5.0 g/dL    Total Protein 7.5 6.4-8.2 g/dL   Bilirubin,Total 0.2 0.2-1.0 mg/dL   Osmolality 283 275-301   Anion Gap 6 (L) 7-16   EGFR (African American) >60 >48m/min   EGFR (Non-African Amer.) 59 (L) >696mmin  Lipase, blood  Result Value Ref Range   Lipase 139 73-393 Unit/L      Assessment & Plan:   Problem List Items Addressed This Visit      Unprioritized   Sleep apnea - Primary   Extreme obesity ([place]      Follow up plan: Return if symptoms worsen or fail to improve.  Form will be sent to [facility] with this note but will need a note from the bariatric clinic who referred her for the sleep study

## 2015-09-25 ENCOUNTER — Encounter
Admission: RE | Admit: 2015-09-25 | Discharge: 2015-09-25 | Disposition: A | Discharge status: Home / Self Care | Payer: 59 | Source: Ambulatory Visit | Attending: Specialist | Admitting: Specialist

## 2015-09-25 DIAGNOSIS — Z01812 Encounter for preprocedural laboratory examination: Secondary | ICD-10-CM | POA: Diagnosis not present

## 2015-09-25 HISTORY — DX: Sleep apnea, unspecified: G47.30

## 2015-09-25 LAB — ABO/RH: ABO/RH(D): O POS

## 2015-09-25 LAB — TYPE AND SCREEN
ABO/RH(D): O POS
Antibody Screen: NEGATIVE

## 2015-09-25 NOTE — Patient Instructions (Signed)
  Your procedure is scheduled on: October 03, 2015 (Tuesday) Report to Day Surgery.[facility]) To find out your arrival time please call [phone] between 1PM - 3PM on October 02, 2015 (Monday).  Remember: Instructions that are not followed completely may result in serious medical risk, up to and including death, or upon the discretion of your surgeon and anesthesiologist your surgery may need to be rescheduled.    __x__ 1. Do not eat food or drink liquids after midnight. No gum chewing or hard candies.     ____ 2. No Alcohol for 24 hours before or after surgery.   ____ 3. Bring all medications with you on the day of surgery if instructed.    __x__ 4. Notify your doctor if there is any change in your medical condition     (cold, fever, infections).     Do not wear jewelry, make-up, hairpins, clips or nail polish.  Do not wear lotions, powders, or perfumes. You may wear deodorant.  Do not shave 48 hours prior to surgery. Men may shave face and neck.  Do not bring valuables to the hospital.    [facility] not [facility] for any belongings or valuables.               Contacts, dentures or bridgework may not be worn into surgery.  Leave your suitcase in the car. After surgery it may be brought to your room.  For patients admitted to the hospital, discharge time is determined by your                treatment team.   Patients discharged the day of surgery will not be allowed to drive home.   Please read over the following fact sheets that you were given:   Surgical Site Infection Prevention   ____ Take these medicines the morning of surgery with A SIP OF WATER:    1.   2.   3.   4.  5.  6.  ____ Fleet Enema (as directed)   _x___ Use CHG Soap as directed  ____ Use inhalers on the day of surgery  ____ Stop metformin 2 days prior to surgery    ____ Take 1/2 of usual insulin dose the night before surgery and none on the morning of surgery.   ____ Stop  Coumadin/Plavix/aspirin on   __x__ Stop Anti-inflammatories on (Tylenol ok to take for pain if needed)   ____ Stop supplements until after surgery.    __x__ Bring C-Pap to the hospital.

## 2015-10-03 ENCOUNTER — Inpatient Hospital Stay: Payer: 59 | Admitting: Anesthesiology

## 2015-10-03 ENCOUNTER — Inpatient Hospital Stay
Admission: RE | Admit: 2015-10-03 | Discharge: 2015-10-04 | DRG: 621 | Disposition: A | Discharge status: Home / Self Care | Payer: 59 | Source: Ambulatory Visit | Attending: Specialist | Admitting: Specialist

## 2015-10-03 ENCOUNTER — Encounter
Admission: RE | Disposition: A | Discharge status: Home / Self Care | Payer: Self-pay | Source: Ambulatory Visit | Attending: Specialist

## 2015-10-03 DIAGNOSIS — G4730 Sleep apnea, unspecified: Secondary | ICD-10-CM | POA: Diagnosis present

## 2015-10-03 DIAGNOSIS — F419 Anxiety disorder, unspecified: Secondary | ICD-10-CM | POA: Diagnosis present

## 2015-10-03 DIAGNOSIS — R569 Unspecified convulsions: Secondary | ICD-10-CM | POA: Diagnosis present

## 2015-10-03 DIAGNOSIS — F329 Major depressive disorder, single episode, unspecified: Secondary | ICD-10-CM | POA: Diagnosis present

## 2015-10-03 DIAGNOSIS — Z9884 Bariatric surgery status: Secondary | ICD-10-CM

## 2015-10-03 DIAGNOSIS — Z6841 Body Mass Index (BMI) 40.0 and over, adult: Secondary | ICD-10-CM | POA: Diagnosis not present

## 2015-10-03 HISTORY — PX: GASTRIC ROUX-EN-Y: SHX5262

## 2015-10-03 LAB — HEMOGLOBIN AND HEMATOCRIT, BLOOD
HCT: 38.6 % (ref 35.0–47.0)
Hemoglobin: 12.3 g/dL (ref 12.0–16.0)

## 2015-10-03 LAB — CREATININE, SERUM
Creatinine, Ser: 1.14 mg/dL — ABNORMAL HIGH (ref 0.44–1.00)
GFR calc Af Amer: >60 mL/min (ref >60)
GFR calc non Af Amer: >60 mL/min (ref >60)

## 2015-10-03 LAB — POCT PREGNANCY, URINE: PREG TEST UR: NEGATIVE

## 2015-10-03 SURGERY — LAPAROSCOPIC ROUX-EN-Y GASTRIC
Anesthesia: General

## 2015-10-03 MED ORDER — HYDROMORPHONE HCL 1 MG/ML IJ SOLN
INTRAMUSCULAR | Status: AC
  Administered 2015-10-03: 0.5 mg via INTRAVENOUS
  Filled 2015-10-03: qty 1

## 2015-10-03 MED ORDER — SUCCINYLCHOLINE CHLORIDE 20 MG/ML IJ SOLN
INTRAMUSCULAR | Status: DC | PRN
  Administered 2015-10-03: 140 mg via INTRAVENOUS

## 2015-10-03 MED ORDER — PREMIER PROTEIN SHAKE
2.0000 [oz_av] | Freq: Four times a day (QID) | ORAL | Status: DC
  Filled 2015-10-03: qty 325.31

## 2015-10-03 MED ORDER — LACTATED RINGERS IV SOLN
INTRAVENOUS | Status: DC
  Administered 2015-10-03 (×2): via INTRAVENOUS

## 2015-10-03 MED ORDER — DEXAMETHASONE SODIUM PHOSPHATE 4 MG/ML IJ SOLN
INTRAMUSCULAR | Status: DC | PRN
  Administered 2015-10-03: 10 mg via INTRAVENOUS
  Administered 2015-10-03: 8 mg via INTRAVENOUS

## 2015-10-03 MED ORDER — ACETAMINOPHEN 10 MG/ML IV SOLN
1000.0000 mg | Freq: Once | INTRAVENOUS | Status: AC
  Administered 2015-10-03: 1000 mg via INTRAVENOUS

## 2015-10-03 MED ORDER — DEXMEDETOMIDINE HCL 200 MCG/2ML IV SOLN
INTRAVENOUS | Status: DC | PRN
  Administered 2015-10-03: 8 ug via INTRAVENOUS

## 2015-10-03 MED ORDER — FENTANYL CITRATE (PF) 100 MCG/2ML IJ SOLN
INTRAMUSCULAR | Status: AC
  Administered 2015-10-03: 25 ug via INTRAVENOUS
  Filled 2015-10-03: qty 2

## 2015-10-03 MED ORDER — ONDANSETRON HCL 4 MG/2ML IJ SOLN
INTRAMUSCULAR | Status: DC | PRN
  Administered 2015-10-03: 4 mg via INTRAVENOUS

## 2015-10-03 MED ORDER — PROPOFOL 10 MG/ML IV BOLUS
INTRAVENOUS | Status: DC | PRN
  Administered 2015-10-03: 180 mg via INTRAVENOUS

## 2015-10-03 MED ORDER — FENTANYL CITRATE (PF) 100 MCG/2ML IJ SOLN
INTRAMUSCULAR | Status: DC | PRN
  Administered 2015-10-03 (×4): 50 ug via INTRAVENOUS
  Administered 2015-10-03: 100 ug via INTRAVENOUS
  Administered 2015-10-03 (×3): 50 ug via INTRAVENOUS

## 2015-10-03 MED ORDER — ONDANSETRON HCL 4 MG PO TABS: 4.0000 mg | ORAL_TABLET | ORAL | Status: DC | PRN

## 2015-10-03 MED ORDER — PROMETHAZINE HCL 25 MG/ML IJ SOLN: 12.5000 mg | Freq: Four times a day (QID) | INTRAMUSCULAR | Status: DC | PRN

## 2015-10-03 MED ORDER — LACTATED RINGERS IV SOLN
INTRAVENOUS | Status: DC
  Administered 2015-10-03: 13:00:00 via INTRAVENOUS

## 2015-10-03 MED ORDER — PROMETHAZINE HCL 25 MG/ML IJ SOLN
INTRAMUSCULAR | Status: AC
  Administered 2015-10-03: 6.25 mg via INTRAVENOUS
  Filled 2015-10-03: qty 1

## 2015-10-03 MED ORDER — ENOXAPARIN SODIUM 30 MG/0.3ML ~~LOC~~ SOLN
30.0000 mg | Freq: Two times a day (BID) | SUBCUTANEOUS | Status: DC
  Administered 2015-10-04: 30 mg via SUBCUTANEOUS
  Filled 2015-10-03: qty 0.3

## 2015-10-03 MED ORDER — SODIUM CHLORIDE 0.9 % IJ SOLN
INTRAMUSCULAR | Status: AC
  Filled 2015-10-03: qty 3

## 2015-10-03 MED ORDER — PANTOPRAZOLE SODIUM 40 MG IV SOLR
40.0000 mg | Freq: Every day | INTRAVENOUS | Status: DC
Start: 2015-10-03 — End: 2015-10-04
  Administered 2015-10-03: 40 mg via INTRAVENOUS
  Filled 2015-10-03: qty 40

## 2015-10-03 MED ORDER — SCOPOLAMINE 1 MG/3DAYS TD PT72
MEDICATED_PATCH | TRANSDERMAL | Status: AC
  Administered 2015-10-03: 1.5 mg via TRANSDERMAL
  Filled 2015-10-03: qty 1

## 2015-10-03 MED ORDER — CLINDAMYCIN PHOSPHATE 600 MG/50ML IV SOLN
600.0000 mg | Freq: Once | INTRAVENOUS | Status: AC
  Administered 2015-10-03: 600 mg via INTRAVENOUS

## 2015-10-03 MED ORDER — GENTAMICIN SULFATE 40 MG/ML IJ SOLN
1.5000 mg/kg | Freq: Once | INTRAVENOUS | Status: AC
  Administered 2015-10-03: 250 mg via INTRAVENOUS
  Filled 2015-10-03: qty 6.25

## 2015-10-03 MED ORDER — FENTANYL CITRATE (PF) 100 MCG/2ML IJ SOLN
INTRAMUSCULAR | Status: AC
  Administered 2015-10-03: 50 ug via INTRAVENOUS
  Filled 2015-10-03: qty 2

## 2015-10-03 MED ORDER — METOCLOPRAMIDE HCL 5 MG/ML IJ SOLN
INTRAMUSCULAR | Status: DC | PRN
  Administered 2015-10-03: 10 mg via INTRAVENOUS

## 2015-10-03 MED ORDER — INFLUENZA VAC SPLIT QUAD 0.5 ML IM SUSY
0.5000 mL | PREFILLED_SYRINGE | INTRAMUSCULAR | Status: DC
Start: 2015-10-04 — End: 2015-10-04

## 2015-10-03 MED ORDER — SCOPOLAMINE 1 MG/3DAYS TD PT72
1.0000 | MEDICATED_PATCH | TRANSDERMAL | Status: DC
  Administered 2015-10-03: 1.5 mg via TRANSDERMAL

## 2015-10-03 MED ORDER — LIDOCAINE HCL (CARDIAC) 20 MG/ML IV SOLN
INTRAVENOUS | Status: DC | PRN
  Administered 2015-10-03: 100 mg via INTRAVENOUS

## 2015-10-03 MED ORDER — ONDANSETRON HCL 4 MG/2ML IJ SOLN
4.0000 mg | INTRAMUSCULAR | Status: DC | PRN
  Administered 2015-10-03 – 2015-10-04 (×4): 4 mg via INTRAVENOUS
  Filled 2015-10-03 (×4): qty 2

## 2015-10-03 MED ORDER — SODIUM CHLORIDE 0.9 % IJ SOLN
INTRAMUSCULAR | Status: AC
  Filled 2015-10-03: qty 10

## 2015-10-03 MED ORDER — ACETAMINOPHEN 10 MG/ML IV SOLN
INTRAVENOUS | Status: AC
  Filled 2015-10-03: qty 100

## 2015-10-03 MED ORDER — PROMETHAZINE HCL 25 MG/ML IJ SOLN
6.2500 mg | Freq: Once | INTRAMUSCULAR | Status: AC
  Administered 2015-10-03: 6.25 mg via INTRAVENOUS

## 2015-10-03 MED ORDER — HYDROMORPHONE HCL 1 MG/ML IJ SOLN
1.0000 mg | INTRAMUSCULAR | Status: DC | PRN
  Administered 2015-10-03: 2 mg via INTRAVENOUS
  Administered 2015-10-03: 1 mg via INTRAVENOUS
  Administered 2015-10-04 (×2): 2 mg via INTRAVENOUS
  Filled 2015-10-03 (×3): qty 2
  Filled 2015-10-03: qty 1

## 2015-10-03 MED ORDER — ACETAMINOPHEN 160 MG/5ML PO SOLN: 650.0000 mg | ORAL | Status: DC | PRN

## 2015-10-03 MED ORDER — OXYCODONE HCL 5 MG PO TABS: 5.0000 mg | ORAL_TABLET | Freq: Once | ORAL | Status: DC | PRN

## 2015-10-03 MED ORDER — CLINDAMYCIN PHOSPHATE 600 MG/50ML IV SOLN
INTRAVENOUS | Status: AC
  Administered 2015-10-03: 600 mg via INTRAVENOUS
  Filled 2015-10-03: qty 50

## 2015-10-03 MED ORDER — ENALAPRILAT 1.25 MG/ML IV SOLN
1.2500 mg | Freq: Four times a day (QID) | INTRAVENOUS | Status: DC | PRN
  Filled 2015-10-03: qty 1

## 2015-10-03 MED ORDER — GLYCOPYRROLATE 0.2 MG/ML IJ SOLN
INTRAMUSCULAR | Status: DC | PRN
  Administered 2015-10-03: 0.2 mg via INTRAVENOUS

## 2015-10-03 MED ORDER — SUGAMMADEX SODIUM 500 MG/5ML IV SOLN
INTRAVENOUS | Status: DC | PRN
  Administered 2015-10-03: 318.8 mg via INTRAVENOUS

## 2015-10-03 MED ORDER — FENTANYL CITRATE (PF) 100 MCG/2ML IJ SOLN
25.0000 ug | INTRAMUSCULAR | Status: DC | PRN
  Administered 2015-10-03: 25 ug via INTRAVENOUS
  Administered 2015-10-03: 50 ug via INTRAVENOUS
  Administered 2015-10-03 (×3): 25 ug via INTRAVENOUS

## 2015-10-03 MED ORDER — OXYCODONE HCL 5 MG/5ML PO SOLN: 5.0000 mg | Freq: Once | ORAL | Status: DC | PRN

## 2015-10-03 MED ORDER — HYDROCODONE-ACETAMINOPHEN 7.5-325 MG/15ML PO SOLN
5.0000 mL | ORAL | Status: DC | PRN
  Administered 2015-10-04: 10 mL via ORAL
  Filled 2015-10-03: qty 15

## 2015-10-03 MED ORDER — FLUOXETINE HCL 20 MG PO CAPS
20.0000 mg | ORAL_CAPSULE | Freq: Every day | ORAL | Status: DC
  Administered 2015-10-04: 20 mg via ORAL
  Filled 2015-10-03: qty 1

## 2015-10-03 MED ORDER — KCL IN DEXTROSE-NACL 20-5-0.45 MEQ/L-%-% IV SOLN
INTRAVENOUS | Status: DC
  Administered 2015-10-03 – 2015-10-04 (×3): via INTRAVENOUS
  Filled 2015-10-03 (×5): qty 1000

## 2015-10-03 MED ORDER — ROCURONIUM BROMIDE 100 MG/10ML IV SOLN
INTRAVENOUS | Status: DC | PRN
  Administered 2015-10-03 (×3): 10 mg via INTRAVENOUS
  Administered 2015-10-03: 40 mg via INTRAVENOUS
  Administered 2015-10-03: 10 mg via INTRAVENOUS

## 2015-10-03 MED ORDER — DIPHENHYDRAMINE HCL 50 MG/ML IJ SOLN: 25.0000 mg | Freq: Four times a day (QID) | INTRAMUSCULAR | Status: DC | PRN

## 2015-10-03 MED ORDER — HYDROMORPHONE HCL 1 MG/ML IJ SOLN
0.5000 mg | INTRAMUSCULAR | Status: AC | PRN
  Administered 2015-10-03 (×4): 0.5 mg via INTRAVENOUS

## 2015-10-03 MED ORDER — MIDAZOLAM HCL 2 MG/2ML IJ SOLN
INTRAMUSCULAR | Status: DC | PRN
  Administered 2015-10-03: 2 mg via INTRAVENOUS

## 2015-10-03 SURGICAL SUPPLY — 48 items
APPLIER CLIP ROT 13.4 12 LRG (CLIP) ×3
BANDAGE ELASTIC 6 LF NS (GAUZE/BANDAGES/DRESSINGS) ×6 IMPLANT
BLADE SURG SZ11 CARB STEEL (BLADE) ×3 IMPLANT
CANISTER SUCT 1200ML W/VALVE (MISCELLANEOUS) ×3 IMPLANT
CHLORAPREP W/TINT 26ML (MISCELLANEOUS) ×6 IMPLANT
CLIP APPLIE ROT 13.4 12 LRG (CLIP) ×1 IMPLANT
DECANTER SPIKE VIAL GLASS SM (MISCELLANEOUS) ×6 IMPLANT
DEFOGGER SCOPE WARMER CLEARIFY (MISCELLANEOUS) ×3 IMPLANT
DRAPE UTILITY 15X26 TOWEL STRL (DRAPES) ×6 IMPLANT
FILTER LAP SMOKE EVAC STRL (MISCELLANEOUS) ×3 IMPLANT
GLOVE BIO SURGEON STRL SZ 6.5 (GLOVE) ×2 IMPLANT
GLOVE BIO SURGEON STRL SZ8 (GLOVE) ×3 IMPLANT
GLOVE BIO SURGEONS STRL SZ 6.5 (GLOVE) ×1
GOWN STRL REUS W/ TWL LRG LVL3 (GOWN DISPOSABLE) ×3 IMPLANT
GOWN STRL REUS W/ TWL XL LVL3 (GOWN DISPOSABLE) ×2 IMPLANT
GOWN STRL REUS W/TWL LRG LVL3 (GOWN DISPOSABLE) ×6
GOWN STRL REUS W/TWL XL LVL3 (GOWN DISPOSABLE) ×4
IRRIGATION STRYKERFLOW (MISCELLANEOUS) ×1 IMPLANT
IRRIGATOR STRYKERFLOW (MISCELLANEOUS) ×3
IV NS 1000ML (IV SOLUTION) ×2
IV NS 1000ML BAXH (IV SOLUTION) ×1 IMPLANT
KIT RM TURNOVER STRD PROC AR (KITS) ×3 IMPLANT
LABEL OR SOLS (LABEL) ×3 IMPLANT
LIQUID BAND (GAUZE/BANDAGES/DRESSINGS) ×3 IMPLANT
NDL INSUFF 14G 150MM VS150000 (NEEDLE) ×3 IMPLANT
NDL SAFETY 22GX1.5 (NEEDLE) ×3 IMPLANT
NS IRRIG 1000ML POUR BTL (IV SOLUTION) ×3 IMPLANT
PACK LAP CHOLECYSTECTOMY (MISCELLANEOUS) ×3 IMPLANT
RELOAD BLUE (STAPLE) ×6 IMPLANT
RELOAD STAPLER GOLD 60MM (STAPLE) ×2 IMPLANT
RELOAD STAPLER WHITE 60MM (STAPLE) IMPLANT
SHEARS HARMONIC ACE PLUS 45CM (MISCELLANEOUS) ×3 IMPLANT
SLEEVE ENDOPATH XCEL 5M (ENDOMECHANICALS) ×6 IMPLANT
SLEEVE GASTRECTOMY 36FR VISIGI (MISCELLANEOUS) ×3 IMPLANT
STAPLER ECHELON BIOABSB 60 FLE (MISCELLANEOUS) ×15 IMPLANT
STAPLER ECHELON LONG 60 440 (INSTRUMENTS) ×3 IMPLANT
STAPLER RELOAD GOLD 60MM (STAPLE) ×6
STAPLER RELOAD WHITE 60MM (STAPLE)
SUT DEVICE BRAIDED 0X39 (SUTURE) ×6 IMPLANT
SUT DEVICE BRAIDED 2.0X39 (SUTURE) ×15 IMPLANT
SUT VIC AB 4-0 PS2 18 (SUTURE) ×6 IMPLANT
SYR 20CC LL (SYRINGE) ×3 IMPLANT
TROCAR SL VERSASTEP 5M LG  B (MISCELLANEOUS) ×2
TROCAR SL VERSASTEP 5M LG B (MISCELLANEOUS) ×1 IMPLANT
TROCAR XCEL 12X100 BLDLESS (ENDOMECHANICALS) ×3 IMPLANT
TROCAR XCEL NON-BLD 5MMX100MML (ENDOMECHANICALS) ×3 IMPLANT
TUBING INSUFFLATOR HEATED (MISCELLANEOUS) ×3 IMPLANT
WATER STERILE IRR 1000ML POUR (IV SOLUTION) ×3 IMPLANT

## 2015-10-03 NOTE — Op Note (Signed)
Preoperative diagnosis: Morbid obesity Postoperative diagnosis: Same Procedure: Laparoscopic Roux-en-Y gastric bypass Surgeon: Smitty CordsBruce Asst.: Drinkwater PA Anesthesia: Gen. endotracheal Blood loss: None Specimens: None Anesthesia: Gen. endotracheal Complications: None  Clinical history: See history and physical  Details of procedure: The patient was taken to the operating room placed on the operating table in the supine position. Timeout was performed. Patient was placed under anesthesia without incident. IV antibiotics were given. Sequential stockings were placed. A 36 French Visi-G was placed.  The abdomen was accessed using a 5 mm optical trocar and left upper outer quadrant. Pneumoperitoneum was established. Multiple other trochars were placed without incident. A liver retractor was eventually placed as well. The small bowel was measured from the ligament of Treitz 50 cm and divided using a white load reinforced with SEAMGUARD. Mesentery is taken down using Harmonic scalpel freeing up enough length to mobilize the Roux limb into the left upper quadrant. A 125 Center Roux limb was measured out and a side-to-side enteroenterostomy was made with the biliopancreatic limb a suture was placed between 2 spell and enterotomies were made both lumen. A echelon white load 60 mm stapler was inserted full length into the lumen of both and fired. The resultant defect was coapted using interrupted Surgidek suture and then a blue load 60 mm fire across the anastomosis closing approximate the edges and the excess tissue was discarded. Lives were placed on small oozing area on this area and the babies but stitch was placed 1 with a 20 Surgidek. The mesenteric defect was closed using a running 20 barbed suture. The omentum was divided on the path of the Roux limb TO the transverse colon and laterally to: Might make a nice clear path. The Roux limb was tacked to the greater curvature stomach using interrupted 20  Surgidek suture. Liver retractor was placed to elevate the liver and patient placed in steep reverse Trendelenburg position. The hiatus was identified and no significant hiatal hernia was present. The gastric pack mesentery was taken down to the lesser curvature using a white load in [facility]. Multiple fires of gold load with SEAMGUARD were used 3 to create a small gastric pouch. The Roux limb was then mobilized up to this point and a stay suture is placed between the 2 laterally. Enterotomies were made in the pouch and the small bowel and a blue load was inserted to 35 mm and fired. The defect was then closed using running 2-0 Polysorb 1 for the first layer. The 36 French bougie was then advanced through the anastomosis to prevent crimping of the anastomosis on the second layer. A second layer of 2-0 Polysorb was run to create a 2 layer anastomosis. Leak test was performed by insufflating through the bougie with the Roux limb clamped and no significant leak was detected under saline. The mesenteric defect was closed a Peterson using running 20 barbed suture as well. The trochars and liver retractor removed. The wounds closed using 4-0 Vicryl and Dermabond. The patient tolerated procedure well [place] room in stable condition.

## 2015-10-03 NOTE — Transfer of Care (Signed)
Immediate Anesthesia Transfer of Care Note  Patient: Audrey LoganHeather V Fader  Procedure(s) Performed: Procedure(s): LAPAROSCOPIC ROUX-EN-Y GASTRIC (N/A)  Patient Location: PACU  Anesthesia Type:General  Level of Consciousness: awake, alert  and oriented  Airway & Oxygen Therapy: Patient Spontanous Breathing and Patient connected to face mask oxygen  Post-op Assessment: Report given to RN and Post -op Vital signs reviewed and stable  Post vital signs: Reviewed and stable  Last Vitals: 15:43 104 hr 98% sat 20 resp 144/79 Filed Vitals:   10/03/15 1233 10/03/15 1333  BP: 109/67   Temp: 37.7 C 37.5 C  Resp: 20     Complications: No apparent anesthesia complications

## 2015-10-03 NOTE — OR Nursing (Signed)
Dr Smitty CordsBruce in to see patient. Verified procedure with MD.

## 2015-10-03 NOTE — H&P (Signed)
  See scanned H and P

## 2015-10-03 NOTE — Brief Op Note (Cosign Needed)
10/03/2015  3:38 PM  PATIENT:  Burnice LoganHeather V Saini  34 y.o. female  PRE-OPERATIVE DIAGNOSIS:  MORBID OBESITY  POST-OPERATIVE DIAGNOSIS:  Same  PROCEDURE:  Procedure(s): LAPAROSCOPIC ROUX-EN-Y GASTRIC (N/A)  SURGEON:  Surgeon(s) and Role:    * Geoffry ParadiseJon M Bruce, MD - Primary  PHYSICIAN ASSISTANT: Juwon Scripter, PA-C  ANESTHESIA:   general  EBL:     BLOOD ADMINISTERED:none  DRAINS: none   LOCAL MEDICATIONS USED:  MARCAINE    and LIDOCAINE   SPECIMEN:  No Specimen  DISPOSITION OF SPECIMEN:  PATHOLOGY  COUNTS:  YES  TOURNIQUET:  * No tourniquets in log *  DICTATION: .Note written in EPIC  PLAN OF CARE: Admit to inpatient   PATIENT DISPOSITION:  PACU - hemodynamically stable.   Delay start of Pharmacological VTE agent (>24hrs) due to surgical blood loss or risk of bleeding: no

## 2015-10-03 NOTE — Anesthesia Preprocedure Evaluation (Signed)
Anesthesia Evaluation  Patient identified by MRN, date of birth, ID band Patient awake    Reviewed: Allergy & Precautions, H&P , NPO status , Patient's Chart, lab work & pertinent test results  History of Anesthesia Complications Negative for: history of anesthetic complications  Airway Mallampati: II  TM Distance: >3 FB Neck ROM: full    Dental no notable dental hx. (+) Teeth Intact   Pulmonary neg shortness of breath, sleep apnea ,    Pulmonary exam normal breath sounds clear to auscultation       Cardiovascular Exercise Tolerance: Good (-) angina(-) Past MI and (-) DOE negative cardio ROS Normal cardiovascular exam Rhythm:regular Rate:Normal     Neuro/Psych Seizures -,  PSYCHIATRIC DISORDERS Anxiety Depression    GI/Hepatic negative GI ROS, Neg liver ROS,   Endo/Other  diabetes, Type 2  Renal/GU negative Renal ROS  negative genitourinary   Musculoskeletal   Abdominal   Peds  Hematology negative hematology ROS (+)   Anesthesia Other Findings Past Medical History:   Depression                                                   Anxiety                                                      Abnormal glucose                                             Seizures (HCC)                                                 Comment:stress seizure   Obesity                                                      Sleep apnea                                                 History reviewed. No pertinent surgical history.  BMI    Body Mass Index   55.03 kg/m 2      Reproductive/Obstetrics negative OB ROS                             Anesthesia Physical Anesthesia Plan  ASA: III  Anesthesia Plan: General ETT   Post-op Pain Management:    Induction:   Airway Management Planned:   Additional Equipment:   Intra-op Plan:   Post-operative Plan:   Informed Consent: I have reviewed the patients  History and Physical, chart, labs and discussed the procedure including the  risks, benefits and alternatives for the proposed anesthesia with the patient or authorized representative who has indicated his/her understanding and acceptance.   Dental Advisory Given  Plan Discussed with: Anesthesiologist, CRNA and Surgeon  Anesthesia Plan Comments:         Anesthesia Quick Evaluation

## 2015-10-03 NOTE — Anesthesia Postprocedure Evaluation (Signed)
Anesthesia Post Note  Patient: Audrey LoganHeather V Wolfe  Procedure(s) Performed: Procedure(s) (LRB): LAPAROSCOPIC ROUX-EN-Y GASTRIC (N/A)  Patient location during evaluation: PACU Anesthesia Type: General Level of consciousness: awake Pain management: satisfactory to patient Vital Signs Assessment: post-procedure vital signs reviewed and stable Respiratory status: nonlabored ventilation Cardiovascular status: stable Anesthetic complications: no    Last Vitals:  Filed Vitals:   10/03/15 1333 10/03/15 1543  BP:  144/79  Pulse:  114  Temp: 37.5 C 37 C  Resp:  15    Last Pain: There were no vitals filed for this visit.               VAN STAVEREN,Marene Gilliam

## 2015-10-04 ENCOUNTER — Encounter: Payer: Self-pay | Admitting: Specialist

## 2015-10-04 LAB — CBC WITH DIFFERENTIAL/PLATELET
BASOS ABS: 0 10*3/uL (ref 0–0.1)
Basophils Relative: 0 %
EOS PCT: 0 %
Eosinophils Absolute: 0 10*3/uL (ref 0–0.7)
HCT: 38.8 % (ref 35.0–47.0)
Hemoglobin: 12.3 g/dL (ref 12.0–16.0)
LYMPHS ABS: 0.5 10*3/uL — AB (ref 1.0–3.6)
LYMPHS PCT: 4 %
MCH: 25 pg — AB (ref 26.0–34.0)
MCHC: 31.6 g/dL — AB (ref 32.0–36.0)
MCV: 78.9 fL — AB (ref 80.0–100.0)
MONO ABS: 0.2 10*3/uL (ref 0.2–0.9)
Monocytes Relative: 2 %
Neutro Abs: 10 10*3/uL — ABNORMAL HIGH (ref 1.4–6.5)
Neutrophils Relative %: 94 %
PLATELETS: 263 10*3/uL (ref 150–440)
RBC: 4.92 MIL/uL (ref 3.80–5.20)
RDW: 17 % — AB (ref 11.5–14.5)
WBC: 10.7 10*3/uL (ref 3.6–11.0)

## 2015-10-04 MED ORDER — OXYCODONE HCL 5 MG/5ML PO SOLN: 5.0000 mg | ORAL | Status: DC | PRN

## 2015-10-04 MED ORDER — UNJURY CHICKEN SOUP POWDER
2.0000 [oz_av] | Freq: Three times a day (TID) | ORAL | Status: DC
  Administered 2015-10-04 (×2): 2 [oz_av] via ORAL

## 2015-10-04 NOTE — Progress Notes (Signed)
Patient alert and oriented, ambulated about 360 ft without complications.  Tolerating protein drinks well with one episode of vomiting (very small amount of emesis).  Remains on 2L O2 nasal cannula due to low oxygen saturation of 87%.   Arturo MortonClay, Jozette Castrellon N   10/04/2015  6:28 AM

## 2015-10-04 NOTE — Progress Notes (Signed)
INTERVENTION:  RD consulted for nutrition education regarding inpatient bariatric surgery.   RD provided "The Liquid Diet" handout from the Bariatric Surgery Guide from the [facility]. This handout previously provided to patient prior to surgery is a duplicate copy. Discussed what foods/liquids are consistent with a Clear Liquid Diet and reinforced Key Concepts such as no carbonation, no caffeine, or sugar containing beverages. Provided methods to prevent dehydration and promote protein intake, using clock and sample fluid schedule. RD encouraged follow-up with outpatient dietitian after discharge.  Teach back method used.  Expect good compliance.  NUTRITION DIAGNOSIS:  Food and nutrition knowledge related deficit related to recent bariatric surgery as evidenced by dietitian consult for nutrition education   GOAL:  Patient will be able to sip and tolerate CL within 24-48 hours  MONITOR:  Energy intake Digestive system  ASSESSMENT:  Pt s/p lap roux-en-y.  Past Medical History  Diagnosis Date  . Depression   . Anxiety   . Abnormal glucose   . Seizures (HCC)     stress seizure  . Obesity   . Sleep apnea      Body mass index is 55.74 kg/(m^2).   Current diet order is bariatric clear liquids with unjury supplement TID, patient is consuming approximately 30ml at this time.   Labs and medications reviewed.   LOW Care Level  Niema Carrara B. Freida BusmanAllen, RD, LDN [phone] (pager)

## 2015-10-06 NOTE — Discharge Summary (Signed)
Physician Discharge Summary Note  Patient:  Audrey Wolfe is an 34 y.o., female MRN:  [id number] DOB:  1981/07/24 Patient phone:  [phone] (home)  Patient address:   [address] [place] [address],  Total Time spent with patient: 15 minutes  Date of Admission:  10/03/2015 Date of Discharge: 10/04/15  Reason for Admission:  Morbid Obesity  Principal Problem: <principal problem not specified> Discharge Diagnoses: Morbid obesity Patient Active Problem List   Diagnosis Date Noted  . Bariatric surgery status [Z98.84] 10/03/2015  . Sleep apnea [G47.30] 09/19/2015  . Extreme obesity ([place]) [E66.01] 09/19/2015  . Anxiety and depression [F41.8] 09/12/2015  . Abnormal glucose level [R73.09] 09/12/2015  . Seizure ([place]) [R56.9] 09/12/2015  . Normal labor [O80, Z37.9] 10/28/2012  . Family planning [Z30.09] 10/20/2012  . Infection due to Streptococcus agalactiae [B95.1] 10/09/2012  . High-risk pregnancy [O09.90] 09/22/2012  . Morbid (severe) obesity due to excess calories ([place]) [E66.01] 06/22/2012  . Diabetes mellitus arising in pregnancy [O24.419] 06/22/2012    Past Psychiatric History: n/a  Past Medical History:  Past Medical History  Diagnosis Date  . Depression   . Anxiety   . Abnormal glucose   . Seizures ([place])     stress seizure  . Obesity   . Sleep apnea     Past Surgical History  Procedure Laterality Date  . Gastric roux-en-y N/A 10/03/2015    Procedure: LAPAROSCOPIC ROUX-EN-Y GASTRIC;  Surgeon: Bonner Puna, MD;  Location: [facility] ORS;  Service: General;  Laterality: N/A;   Family History:  Family History  Problem Relation Age of Onset  . Diabetes Father   . Asthma Daughter   . Cancer Maternal Grandfather     liver  . Alcohol abuse Paternal Grandfather    Family Psychiatric  History: n/a Social History:  History  Alcohol Use No     History  Drug Use No    Social History   Social History  . Marital Status: Married    Spouse Name: N/A  . Number of  Children: N/A  . Years of Education: N/A   Social History Main Topics  . Smoking status: Never Smoker   . Smokeless tobacco: Never Used  . Alcohol Use: No  . Drug Use: No  . Sexual Activity: Yes    Birth Control/ Protection: Injection   Other Topics Concern  . None   Social History Narrative    Hospital Course:  Pt underwent sleeve gastrectomy and did very well. She met discharge criteria pod1 such as adequate oral intake with hydration, tolerating pain and nausea meds orally and ambulating without difficulty.   Physical Findings: AIMS:  , ,  ,  ,    CIWA:    COWS:     Musculoskeletal: Strength & Muscle Tone: within normal limits Gait & Station: normal Patient leans: n/a  Psychiatric Specialty Exam: ROS  Blood pressure 117/54, pulse 93, temperature 98.4 F (36.9 C), temperature source Oral, resp. rate 17, height 5' 7"  (1.702 m), weight 161.481 kg (356 lb), SpO2 96 %.Body mass index is 55.74 kg/(m^2).  General Appearance: Casual  Eye Contact::  Good  Speech:  Normal Rate  Volume:  Normal  Mood:  NA  Affect:  NA  Thought Process:  NA  Orientation:  NA  Thought Content:  NA  Suicidal Thoughts:  No  Homicidal Thoughts:  No  Memory:  NA  Judgement:  Good  Insight:  Good  Psychomotor Activity:  NA  Concentration:  Good  Recall:  Good  Fund of Knowledge:Good  Language: Good  Akathisia:  NA  Handed:  Right  AIMS (if indicated):     Assets:  Communication Skills Desire for Improvement  ADL's:  Intact  Cognition: WNL  Sleep:         Has this patient used any form of tobacco in the last 30 days? (Cigarettes, Smokeless Tobacco, Cigars, and/or Pipes) Yes, N/A  Metabolic Disorder Labs:  No results found for: HGBA1C, MPG No results found for: PROLACTIN No results found for: CHOL, TRIG, HDL, CHOLHDL, VLDL, LDLCALC  See Psychiatric Specialty Exam and Suicide Risk Assessment completed by Attending Physician prior to discharge.  Discharge destination:  Home  Is  patient on multiple antipsychotic therapies at discharge:  No   Has Patient had three or more failed trials of antipsychotic monotherapy by history:  No  Recommended Plan for Multiple Antipsychotic Therapies: NA  Discharge Instructions    Call MD for:  difficulty breathing, headache or visual disturbances    Complete by:  As directed      Call MD for:  extreme fatigue    Complete by:  As directed      Call MD for:  hives    Complete by:  As directed      Call MD for:  persistant dizziness or light-headedness    Complete by:  As directed      Call MD for:  persistant nausea and vomiting    Complete by:  As directed      Call MD for:  redness, tenderness, or signs of infection (pain, swelling, redness, odor or green/yellow discharge around incision site)    Complete by:  As directed      Call MD for:  severe uncontrolled pain    Complete by:  As directed      Call MD for:  temperature >100.4    Complete by:  As directed      Discharge instructions    Complete by:  As directed   During your recent anesthetic, you were given the medication sugammadex ([place]). This medication interacts with hormonal forms of birth control (oral contraceptives and injected or implanted birth control) and may make them ineffective. IFYOU USE ANY HORMONAL FORM OF BIRTH CONTROL, YOU MUST USE AN ADDITIONAL BARRIER BIRTH CONTROL FOR METHOD FOR SEVEN DAYS after receiving sugammadex ([place]) or there is a chance you could become pregnant.     Discharge instructions    Complete by:  As directed   Remember to start your chewable or liquid B complex once you arrive home and take this daily for 30 days. You may continue to take this beyond 30 days if you choose. Stick with a Clear liquid diet for 2 days post operatively then you may advance to a Full Liquid diet for 12 days, which means you will be on a strict liquid diet for 14 days. Your daily goal is going to be to get in 60-80g of protein and 64oz of fluid.      Driving Restrictions    Complete by:  As directed   You may not drive until 24 hours past your last dose of pain medications.     Increase activity slowly    Complete by:  As directed      Lifting restrictions    Complete by:  As directed   Do not lift more than 10-15 pounds for 4-6 weeks post operatively     No dressing needed    Complete by:  As directed      No wound care    Complete by:  As directed      Other Restrictions    Complete by:  As directed   Avoid using your core muscles for 30 days after surgery - motions like pushing, pulling, climbing etc. Make sure to get up and walk frequently every day to help avoid developing blood clots.            Medication List    TAKE these medications      Indication   FLUoxetine 20 MG capsule  Commonly known as:  PROZAC  Take 20 mg by mouth daily.      medroxyPROGESTERone 150 MG/ML injection  Commonly known as:  DEPO-PROVERA  Inject 150 mg into the muscle every 3 (three) months.      ondansetron 4 MG tablet  Commonly known as:  ZOFRAN  Take 1 tablet (4 mg total) by mouth every 4 (four) hours as needed for nausea or vomiting.      oxyCODONE 5 MG/5ML solution  Commonly known as:  ROXICODONE  Take 5-10 mLs (5-10 mg total) by mouth once as needed for moderate pain or severe pain (for pain score of 1-4).      TYLENOL 500 MG tablet  Generic drug:  acetaminophen  Take 1,000 mg by mouth every 6 (six) hours as needed.          Follow-up recommendations:  2 weeks with Dr. Darnell Level  Comments:     Signed: Mardelle Matte 10/06/2015, 1:08 PM

## 2015-10-17 ENCOUNTER — Other Ambulatory Visit: Payer: Self-pay

## 2015-10-17 MED ORDER — FLUOXETINE HCL 20 MG PO CAPS: 20.0000 mg | ORAL_CAPSULE | Freq: Every day | ORAL | Status: DC

## 2015-10-17 NOTE — Telephone Encounter (Signed)
Patient was last seen 09/19/15 and pharmacy is [facility] on [address].

## 2015-11-08 ENCOUNTER — Ambulatory Visit (INDEPENDENT_AMBULATORY_CARE_PROVIDER_SITE_OTHER): Payer: BLUE CROSS/BLUE SHIELD

## 2015-11-08 DIAGNOSIS — Z3009 Encounter for other general counseling and advice on contraception: Secondary | ICD-10-CM | POA: Diagnosis not present

## 2015-11-08 MED ORDER — MEDROXYPROGESTERONE ACETATE 150 MG/ML IM SUSP
150.0000 mg | INTRAMUSCULAR | Status: DC
  Administered 2015-11-08 – 2017-11-11 (×4): 150 mg via INTRAMUSCULAR

## 2015-11-30 ENCOUNTER — Encounter: Payer: Self-pay | Admitting: *Deleted

## 2015-11-30 ENCOUNTER — Emergency Department: Payer: BLUE CROSS/BLUE SHIELD

## 2015-11-30 ENCOUNTER — Emergency Department
Admission: EM | Admit: 2015-11-30 | Discharge: 2015-11-30 | Disposition: A | Discharge status: Home / Self Care | Payer: BLUE CROSS/BLUE SHIELD | Attending: Emergency Medicine | Admitting: Emergency Medicine

## 2015-11-30 DIAGNOSIS — Z880 Allergy status to penicillin: Secondary | ICD-10-CM | POA: Diagnosis not present

## 2015-11-30 DIAGNOSIS — R309 Painful micturition, unspecified: Secondary | ICD-10-CM | POA: Diagnosis not present

## 2015-11-30 DIAGNOSIS — R109 Unspecified abdominal pain: Secondary | ICD-10-CM | POA: Insufficient documentation

## 2015-11-30 DIAGNOSIS — Z9884 Bariatric surgery status: Secondary | ICD-10-CM | POA: Diagnosis not present

## 2015-11-30 DIAGNOSIS — R102 Pelvic and perineal pain: Secondary | ICD-10-CM | POA: Diagnosis not present

## 2015-11-30 DIAGNOSIS — M545 Low back pain: Secondary | ICD-10-CM | POA: Insufficient documentation

## 2015-11-30 DIAGNOSIS — R112 Nausea with vomiting, unspecified: Secondary | ICD-10-CM | POA: Insufficient documentation

## 2015-11-30 DIAGNOSIS — Z3202 Encounter for pregnancy test, result negative: Secondary | ICD-10-CM | POA: Insufficient documentation

## 2015-11-30 LAB — CBC
HEMATOCRIT: 41.5 % (ref 35.0–47.0)
HEMOGLOBIN: 13.4 g/dL (ref 12.0–16.0)
MCH: 25.7 pg — ABNORMAL LOW (ref 26.0–34.0)
MCHC: 32.3 g/dL (ref 32.0–36.0)
MCV: 79.6 fL — AB (ref 80.0–100.0)
Platelets: 188 10*3/uL (ref 150–440)
RBC: 5.21 MIL/uL — AB (ref 3.80–5.20)
RDW: 18.6 % — ABNORMAL HIGH (ref 11.5–14.5)
WBC: 6.5 10*3/uL (ref 3.6–11.0)

## 2015-11-30 LAB — POCT PREGNANCY, URINE: PREG TEST UR: NEGATIVE

## 2015-11-30 LAB — COMPREHENSIVE METABOLIC PANEL
ALT: 57 U/L — ABNORMAL HIGH (ref 14–54)
AST: 39 U/L (ref 15–41)
Albumin: 3.9 g/dL (ref 3.5–5.0)
Alkaline Phosphatase: 56 U/L (ref 38–126)
Anion gap: 7 (ref 5–15)
BUN: 13 mg/dL (ref 6–20)
CHLORIDE: 109 mmol/L (ref 101–111)
CO2: 23 mmol/L (ref 22–32)
Calcium: 8.9 mg/dL (ref 8.9–10.3)
Creatinine, Ser: 0.86 mg/dL (ref 0.44–1.00)
Glucose, Bld: 97 mg/dL (ref 65–99)
POTASSIUM: 4.3 mmol/L (ref 3.5–5.1)
Sodium: 139 mmol/L (ref 135–145)
Total Bilirubin: 0.6 mg/dL (ref 0.3–1.2)
Total Protein: 7.4 g/dL (ref 6.5–8.1)

## 2015-11-30 LAB — URINALYSIS COMPLETE WITH MICROSCOPIC (ARMC ONLY)
Bilirubin Urine: NEGATIVE
GLUCOSE, UA: NEGATIVE mg/dL
HGB URINE DIPSTICK: NEGATIVE
LEUKOCYTES UA: NEGATIVE
Nitrite: NEGATIVE
PROTEIN: 30 mg/dL — AB
SPECIFIC GRAVITY, URINE: 1.028 (ref 1.005–1.030)
pH: 5 (ref 5.0–8.0)

## 2015-11-30 LAB — LIPASE, BLOOD: Lipase: 28 U/L (ref 11–51)

## 2015-11-30 MED ORDER — DIAZEPAM 5 MG PO TABS: 5.0000 mg | ORAL_TABLET | Freq: Three times a day (TID) | ORAL | Status: DC | PRN

## 2015-11-30 MED ORDER — OXYCODONE-ACETAMINOPHEN 5-325 MG PO TABS: 2.0000 | ORAL_TABLET | Freq: Four times a day (QID) | ORAL | Status: DC | PRN

## 2015-11-30 MED ORDER — OXYCODONE-ACETAMINOPHEN 5-325 MG PO TABS
2.0000 | ORAL_TABLET | Freq: Once | ORAL | Status: AC
  Administered 2015-11-30: 2 via ORAL
  Filled 2015-11-30: qty 2

## 2015-11-30 NOTE — ED Provider Notes (Signed)
[facility] Emergency Department Provider Note     Time seen: ----------------------------------------- 2:59 PM on 11/30/2015 -----------------------------------------    I have reviewed the triage vital signs and the nursing notes.   HISTORY  Chief Complaint Abdominal Pain    HPI Audrey Wolfe is a 35 y.o. female who presents to ER for abdominal pain that began this morning. Patient states she gastric bypass in December, states pains in her abdomen and pelvic area mostly pains in her left low back. Patient states she has some slight burning on urination, nothing makes her symptoms better or worse. She has had some associated nausea and vomiting. Currently the pain is moderate.   Past Medical History  Diagnosis Date  . Depression   . Anxiety   . Abnormal glucose   . Seizures (HCC)     stress seizure  . Obesity   . Sleep apnea     Patient Active Problem List   Diagnosis Date Noted  . Bariatric surgery status 10/03/2015  . Sleep apnea 09/19/2015  . Extreme obesity (HCC) 09/19/2015  . Anxiety and depression 09/12/2015  . Abnormal glucose level 09/12/2015  . Seizure (HCC) 09/12/2015  . Normal labor 10/28/2012  . Family planning 10/20/2012  . Infection due to Streptococcus agalactiae 10/09/2012  . High-risk pregnancy 09/22/2012  . Morbid (severe) obesity due to excess calories (HCC) 06/22/2012  . Diabetes mellitus arising in pregnancy 06/22/2012    Past Surgical History  Procedure Laterality Date  . 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 Smoking status: Never Smoker   . Smokeless tobacco: Never Used  . Alcohol Use: No    Review of Systems Constitutional: Negative for fever. Eyes: Negative for visual changes. ENT: Negative for  sore throat. Cardiovascular: Negative for chest pain. Respiratory: Negative for shortness of breath. Gastrointestinal: Positive for abdominal pain and vomiting Genitourinary: Negative for dysuria. Musculoskeletal: Positive for left lower back pain Skin: Negative for rash. Neurological: Negative for headaches, focal weakness or numbness.  10-point ROS otherwise negative.  ____________________________________________   PHYSICAL EXAM:  VITAL SIGNS: ED Triage Vitals  Enc Vitals Group     BP 11/30/15 1122 104/56 mmHg     Pulse Rate 11/30/15 1122 77     Resp 11/30/15 1122 18     Temp 11/30/15 1122 98.2 F (36.8 C)     Temp Source 11/30/15 1122 Oral     SpO2 11/30/15 1122 99 %     Weight 11/30/15 1122 306 lb (138.801 kg)     Height 11/30/15 1122  (1.702 m)     Head Cir --      Peak Flow --      Pain Score 11/30/15 1122 7     Pain Loc --      Pain Edu? --      Excl. in GC? --    Constitutional: Alert and oriented. Well appearing and in no distress. Eyes: Conjunctivae are normal. PERRL. Normal extraocular movements. ENT   Head: Normocephalic and atraumatic.   Nose: No congestion/rhinnorhea.   Mouth/Throat: Mucous membranes are moist.   Neck: No stridor. Cardiovascular: Normal rate, regular rhythm. Normal and symmetric distal pulses are present in all extremities. No murmurs, rubs, or gallops. Respiratory: Normal respiratory effort without tachypnea nor retractions. Breath sounds are clear  and equal bilaterally. No wheezes/rales/rhonchi. Gastrointestinal: Soft and nontender. No distention. No abdominal bruits.  Musculoskeletal: Nontender with normal range of motion in all extremities. No joint effusions.  No lower extremity tenderness nor edema. exquisite left lower back tenderness, seems inferior to CVA area on the left. Neurologic:  Normal speech and language. No gross focal neurologic deficits are appreciated. Speech is normal. No gait instability. Skin:  Skin  is warm, dry and intact. No rash noted. Psychiatric: Mood and affect are normal. Speech and behavior are normal. Patient exhibits appropriate insight and judgment. ____________________________________________  ED COURSE:  Pertinent labs & imaging results that were available during my care of the patient were reviewed by me and considered in my medical decision making (see chart for details).  patient's no distress, will check basic labs and consider imaging for renal stone protocol  ____________________________________________    LABS (pertinent positives/negatives)  Labs Reviewed  COMPREHENSIVE METABOLIC PANEL - Abnormal; Notable for the following:    ALT 57 (*)    All other components within normal limits  CBC - Abnormal; Notable for the following:    RBC 5.21 (*)    MCV 79.6 (*)    MCH 25.7 (*)    RDW 18.6 (*)    All other components within normal limits  URINALYSIS COMPLETEWITH MICROSCOPIC ([facility] ONLY) - Abnormal; Notable for the following:    Color, Urine YELLOW (*)    APPearance CLEAR (*)    Ketones, ur 1+ (*)    Protein, ur 30 (*)    Bacteria, UA RARE (*)    Squamous Epithelial / LPF 0-5 (*)    All other components within normal limits  LIPASE, BLOOD  POC URINE PREG, ED  POCT PREGNANCY, URINE    RADIOLOGY Images were viewed by me   CT renal protocol IMPRESSION: Small LEFT adrenal adenoma.  Post gastric bypass surgery.  No acute intra-abdominal or intrapelvic abnormalities. ____________________________________________  FINAL ASSESSMENT  Flank Pain  imaging as dictated above.  Flank pain is likely musculoskeletal in origin. She'll be discharged with Valium, encouraged to use heating pad, massage and stretching. Labs and CT here are unremarkable.   Emily Filbert, MD   Emily Filbert, MD 11/30/15 [phone]

## 2015-11-30 NOTE — Discharge Instructions (Signed)
Flank Pain °Flank pain refers to pain that is located on the side of the body between the upper abdomen and the back. The pain may occur over a short period of time (acute) or may be long-term or reoccurring (chronic). It may be mild or severe. Flank pain can be caused by many things. °CAUSES  °Some of the more common causes of flank pain include: °· Muscle strains.   °· Muscle spasms.   °· A disease of your spine (vertebral disk disease).   °· A lung infection (pneumonia).   °· Fluid around your lungs (pulmonary edema).   °· A kidney infection.   °· Kidney stones.   °· A very painful skin rash caused by the chickenpox virus (shingles).   °· Gallbladder disease.   °HOME CARE INSTRUCTIONS  °Home care will depend on the cause of your pain. In general, °· Rest as directed by your caregiver. °· Drink enough fluids to keep your urine clear or pale yellow. °· Only take over-the-counter or prescription medicines as directed by your caregiver. Some medicines may help relieve the pain. °· Tell your caregiver about any changes in your pain. °· Follow up with your caregiver as directed. °SEEK IMMEDIATE MEDICAL CARE IF:  °· Your pain is not controlled with medicine.   °· You have new or worsening symptoms. °· Your pain increases.   °· You have abdominal pain.   °· You have shortness of breath.   °· You have persistent nausea or vomiting.   °· You have swelling in your abdomen.   °· You feel faint or pass out.   °· You have blood in your urine. °· You have a fever or persistent symptoms for more than 2-3 days. °· You have a fever and your symptoms suddenly get worse. °MAKE SURE YOU:  °· Understand these instructions. °· Will watch your condition. °· Will get help right away if you are not doing well or get worse. °  °This information is not intended to replace advice given to you by your health care provider. Make sure you discuss any questions you have with your health care provider. °  °Document Released: 12/05/2005 Document  Revised: 07/08/2012 Document Reviewed: 05/28/2012 °Elsevier Interactive Patient Education ©2016 [employer] ° °

## 2015-11-30 NOTE — ED Notes (Signed)
States abd pain that began this AM, pt had gastric bypass in December, states pain in her abd/ pelvic area, states some slight burning upon urination, also states pain in her lower back, pt awake and alert in no acute distress, states some nausea and vomiting

## 2015-12-21 ENCOUNTER — Other Ambulatory Visit: Payer: Self-pay | Admitting: Unknown Physician Specialty

## 2016-04-10 ENCOUNTER — Telehealth: Payer: Self-pay

## 2016-04-10 NOTE — Telephone Encounter (Signed)
Patient called and left me a voicemail wanting to know when her next depo was due. I let her know that her last one was 11/08/15 so she is past due. Patient stated she was currently on her cycle. I told her that when she is off of it, to come by the office and we would have to do a urine pregnancy. As long as it is negative, we can give the depo. I also let patient know that according to documentation on last depo visit, patient needed to schedule a physical. So I went ahead and scheduled that for 05/29/16. I asked for the patient to just come by when her cycle was done to do the urine pregnancy and depo.

## 2016-04-26 ENCOUNTER — Ambulatory Visit (INDEPENDENT_AMBULATORY_CARE_PROVIDER_SITE_OTHER): Payer: Self-pay

## 2016-04-26 DIAGNOSIS — Z3009 Encounter for other general counseling and advice on contraception: Secondary | ICD-10-CM

## 2016-04-26 DIAGNOSIS — Z3042 Encounter for surveillance of injectable contraceptive: Secondary | ICD-10-CM

## 2016-04-26 LAB — PREGNANCY, URINE: Preg Test, Ur: NEGATIVE

## 2016-05-29 ENCOUNTER — Encounter: Payer: Self-pay | Admitting: Unknown Physician Specialty

## 2016-05-29 ENCOUNTER — Ambulatory Visit (INDEPENDENT_AMBULATORY_CARE_PROVIDER_SITE_OTHER): Payer: BLUE CROSS/BLUE SHIELD | Admitting: Unknown Physician Specialty

## 2016-05-29 VITALS — BP 111/69 | HR 78 | Temp 98.7°F | Ht 64.9 in | Wt 244.2 lb

## 2016-05-29 DIAGNOSIS — E669 Obesity, unspecified: Secondary | ICD-10-CM | POA: Diagnosis not present

## 2016-05-29 DIAGNOSIS — Z0000 Encounter for general adult medical examination without abnormal findings: Secondary | ICD-10-CM

## 2016-05-29 LAB — BAYER DCA HB A1C WAIVED: HB A1C: 5.2 % (ref <7.0)

## 2016-05-29 NOTE — Assessment & Plan Note (Signed)
Continuing to lose weight and planning on adding exercise

## 2016-05-29 NOTE — Progress Notes (Signed)
BP 111/69 (BP Location: Left Arm, Patient Position: Sitting, Cuff Size: Large)   Pulse 78   Temp 98.7 F (37.1 C)   Ht 5' 4.9" (1.648 m)   Wt 244 lb 3.2 oz (110.8 kg)   SpO2 99%   BMI 40.76 kg/m    Subjective:    Patient ID: Audrey Wolfe, female    DOB: 04-Feb-1981, 35 y.o.   MRN: [id number]  HPI: Audrey Wolfe is a 35 y.o. female  Chief Complaint  Patient presents with  . Annual Exam   Obesity Following bariatric surgery.  Down 130 pounds since surgery.  She feels well.    Depression Takes Fluoxetine daily Depression screen [facility] 2/9 05/29/2016  Decreased Interest 0  Down, Depressed, Hopeless 0  PHQ - 2 Score 0    Relevant past medical, surgical, family and social history reviewed and updated as indicated. Interim medical history since our last visit reviewed. Allergies and medications reviewed and updated.  Review of Systems  Constitutional: Negative.   HENT: Negative.   Eyes: Negative.   Respiratory: Negative.   Cardiovascular: Negative.   Gastrointestinal: Negative.   Endocrine: Negative.   Genitourinary: Negative.   Musculoskeletal: Negative.   Skin: Negative.   Allergic/Immunologic: Negative.   Neurological: Negative.   Hematological: Negative.   Psychiatric/Behavioral: Negative.     Per HPI unless specifically indicated above     Objective:    BP 111/69 (BP Location: Left Arm, Patient Position: Sitting, Cuff Size: Large)   Pulse 78   Temp 98.7 F (37.1 C)   Ht 5' 4.9" (1.648 m)   Wt 244 lb 3.2 oz (110.8 kg)   SpO2 99%   BMI 40.76 kg/m   Wt Readings from Last 3 Encounters:  05/29/16 244 lb 3.2 oz (110.8 kg)  11/30/15 (!) 306 lb (138.8 kg)  10/03/15 (!) 356 lb (161.5 kg)    Physical Exam  Constitutional: She is oriented to person, place, and time. She appears well-developed and well-nourished.  HENT:  Head: Normocephalic and atraumatic.  Eyes: Pupils are equal, round, and reactive to light. Right eye exhibits no discharge. Left eye exhibits  no discharge. No scleral icterus.  Neck: Normal range of motion. Neck supple. Carotid bruit is not present. No thyromegaly present.  Cardiovascular: Normal rate, regular rhythm and normal heart sounds.  Exam reveals no gallop and no friction rub.   No murmur heard. Pulmonary/Chest: Effort normal and breath sounds normal. No respiratory distress. She has no wheezes. She has no rales.  Abdominal: Soft. Bowel sounds are normal. There is no tenderness. There is no rebound.  Genitourinary: Vagina normal and uterus normal. No breast swelling, tenderness or discharge. Cervix exhibits no motion tenderness, no discharge and no friability. Right adnexum displays no mass, no tenderness and no fullness. Left adnexum displays no mass, no tenderness and no fullness.  Musculoskeletal: Normal range of motion.  Lymphadenopathy:    She has no cervical adenopathy.  Neurological: She is alert and oriented to person, place, and time.  Skin: Skin is warm, dry and intact. No rash noted.  Psychiatric: She has a normal mood and affect. Her speech is normal and behavior is normal. Judgment and thought content normal. Cognition and memory are normal.    Results for orders placed or performed in visit on 04/26/16  Pregnancy, urine  Result Value Ref Range   Preg Test, Ur Negative Negative      Assessment & Plan:   Problem List Items Addressed This Visit  Unprioritized   Obese    Continuing to lose weight and planning on adding exercise       Other Visit Diagnoses    Routine general medical examination at a health care facility    -  Primary   Relevant Orders   CBC with Differential/Platelet   Comprehensive metabolic panel   Lipid Panel w/o Chol/HDL Ratio   Bayer DCA Hb A1c Waived   TSH   IGP, Aptima HPV, rfx 16/18,45   VITAMIN D 25 Hydroxy (Vit-D Deficiency, Fractures)       Follow up plan: Return in about 1 year (around 05/29/2017).

## 2016-05-30 LAB — CBC WITH DIFFERENTIAL/PLATELET
BASOS: 0 %
Basophils Absolute: 0 10*3/uL (ref 0.0–0.2)
EOS (ABSOLUTE): 0.2 10*3/uL (ref 0.0–0.4)
EOS: 3 %
HEMOGLOBIN: 12.7 g/dL (ref 11.1–15.9)
Hematocrit: 39.2 % (ref 34.0–46.6)
IMMATURE GRANS (ABS): 0 10*3/uL (ref 0.0–0.1)
IMMATURE GRANULOCYTES: 0 %
LYMPHS: 28 %
Lymphocytes Absolute: 1.8 10*3/uL (ref 0.7–3.1)
MCH: 27.8 pg (ref 26.6–33.0)
MCHC: 32.4 g/dL (ref 31.5–35.7)
MCV: 86 fL (ref 79–97)
MONOCYTES: 6 %
Monocytes Absolute: 0.4 10*3/uL (ref 0.1–0.9)
NEUTROS PCT: 63 %
Neutrophils Absolute: 4 10*3/uL (ref 1.4–7.0)
PLATELETS: 242 10*3/uL (ref 150–379)
RBC: 4.57 x10E6/uL (ref 3.77–5.28)
RDW: 14.4 % (ref 12.3–15.4)
WBC: 6.3 10*3/uL (ref 3.4–10.8)

## 2016-05-30 LAB — COMPREHENSIVE METABOLIC PANEL
A/G RATIO: 1.5 (ref 1.2–2.2)
ALBUMIN: 3.9 g/dL (ref 3.5–5.5)
ALT: 20 IU/L (ref 0–32)
AST: 20 IU/L (ref 0–40)
Alkaline Phosphatase: 71 IU/L (ref 39–117)
BUN/Creatinine Ratio: 14 (ref 9–23)
BUN: 11 mg/dL (ref 6–20)
Bilirubin Total: 0.4 mg/dL (ref 0.0–1.2)
CALCIUM: 9.3 mg/dL (ref 8.7–10.2)
CHLORIDE: 102 mmol/L (ref 96–106)
CO2: 24 mmol/L (ref 18–29)
Creatinine, Ser: 0.77 mg/dL (ref 0.57–1.00)
GFR, EST AFRICAN AMERICAN: 115 mL/min/{1.73_m2} (ref >59)
GFR, EST NON AFRICAN AMERICAN: 100 mL/min/{1.73_m2} (ref >59)
Globulin, Total: 2.6 g/dL (ref 1.5–4.5)
Glucose: 85 mg/dL (ref 65–99)
POTASSIUM: 4.8 mmol/L (ref 3.5–5.2)
Sodium: 141 mmol/L (ref 134–144)
TOTAL PROTEIN: 6.5 g/dL (ref 6.0–8.5)

## 2016-05-30 LAB — LIPID PANEL W/O CHOL/HDL RATIO
Cholesterol, Total: 121 mg/dL (ref 100–199)
HDL: 39 mg/dL — AB (ref >39)
LDL Calculated: 64 mg/dL (ref 0–99)
Triglycerides: 88 mg/dL (ref 0–149)
VLDL CHOLESTEROL CAL: 18 mg/dL (ref 5–40)

## 2016-05-30 LAB — VITAMIN D 25 HYDROXY (VIT D DEFICIENCY, FRACTURES): VIT D 25 HYDROXY: 28.3 ng/mL — AB (ref 30.0–100.0)

## 2016-05-30 LAB — TSH: TSH: 1.21 u[IU]/mL (ref 0.450–4.500)

## 2016-05-31 LAB — IGP, APTIMA HPV, RFX 16/18,45
HPV APTIMA: NEGATIVE
PAP Smear Comment: 0

## 2016-05-31 NOTE — Progress Notes (Signed)
Normal labs.  Pt notified through mychart

## 2016-10-22 ENCOUNTER — Ambulatory Visit: Payer: Self-pay

## 2016-11-01 ENCOUNTER — Ambulatory Visit (INDEPENDENT_AMBULATORY_CARE_PROVIDER_SITE_OTHER): Payer: Managed Care, Other (non HMO) | Admitting: Unknown Physician Specialty

## 2016-11-01 ENCOUNTER — Encounter: Payer: Self-pay | Admitting: Unknown Physician Specialty

## 2016-11-01 VITALS — BP 100/62 | HR 59 | Temp 98.4°F | Ht 66.7 in | Wt 227.0 lb

## 2016-11-01 DIAGNOSIS — J00 Acute nasopharyngitis [common cold]: Secondary | ICD-10-CM | POA: Diagnosis not present

## 2016-11-01 DIAGNOSIS — N912 Amenorrhea, unspecified: Secondary | ICD-10-CM

## 2016-11-01 DIAGNOSIS — F329 Major depressive disorder, single episode, unspecified: Secondary | ICD-10-CM

## 2016-11-01 DIAGNOSIS — Z3009 Encounter for other general counseling and advice on contraception: Secondary | ICD-10-CM | POA: Diagnosis not present

## 2016-11-01 DIAGNOSIS — F418 Other specified anxiety disorders: Secondary | ICD-10-CM

## 2016-11-01 DIAGNOSIS — F419 Anxiety disorder, unspecified: Secondary | ICD-10-CM

## 2016-11-01 LAB — PREGNANCY, URINE: PREG TEST UR: NEGATIVE

## 2016-11-01 NOTE — Progress Notes (Signed)
BP 100/62 (BP Location: Left Arm, Patient Position: Sitting, Cuff Size: Large)   Pulse (!) 59   Temp 98.4 F (36.9 C)   Ht 5' 6.7" (1.694 m)   Wt 227 lb (103 kg)   SpO2 99%   BMI 35.87 kg/m    Subjective:    Patient ID: Audrey Wolfe, female    DOB: 1981/01/04, 36 y.o.   MRN: [id number]  HPI: Audrey Wolfe is a 36 y.o. female  Chief Complaint  Patient presents with  . Depression                                                                                                       Relevant past medical, surgical, family and social history reviewed and updated as indicated. Interim medical history since our last visit reviewed. Allergies and medications reviewed and updated.  Review of Systems  Per HPI unless specifically indicated above     Objective:    BP 100/62 (BP Location: Left Arm, Patient Position: Sitting, Cuff Size: Large)   Pulse (!) 59   Temp 98.4 F (36.9 C)   Ht 5' 6.7" (1.694 m)   Wt 227 lb (103 kg)   SpO2 99%   BMI 35.87 kg/m   Wt Readings from Last 3 Encounters:  11/01/16 227 lb (103 kg)  05/29/16 244 lb 3.2 oz (110.8 kg)  11/30/15 (!) 306 lb (138.8 kg)    Physical Exam  Results for orders placed or performed in visit on 05/29/16  CBC with Differential/Platelet  Result Value Ref Range   WBC 6.3 3.4 - 10.8 x10E3/uL   RBC 4.57 3.77 - 5.28 x10E6/uL   Hemoglobin 12.7 11.1 - 15.9 g/dL   Hematocrit 78.239.2 95.634.0 - 46.6 %   MCV 86 79 - 97 fL   MCH 27.8 26.6 - 33.0 pg   MCHC 32.4 31.5 - 35.7 g/dL   RDW 21.314.4 08.612.3 - 57.815.4 %   Platelets 242 150 - 379 x10E3/uL   Neutrophils 63 %   Lymphs 28 %   Monocytes 6 %   Eos 3 %   Basos 0 %   Neutrophils Absolute 4.0 1.4 - 7.0 x10E3/uL   Lymphocytes Absolute 1.8 0.7 - 3.1 x10E3/uL   Monocytes Absolute 0.4 0.1 - 0.9 x10E3/uL   EOS (ABSOLUTE) 0.2 0.0 - 0.4 x10E3/uL   Basophils Absolute 0.0 0.0 - 0.2 x10E3/uL   Immature Granulocytes 0 %   Immature Grans (Abs) 0.0 0.0 - 0.1 x10E3/uL  Comprehensive metabolic panel  Result Value Ref Range   Glucose 85 65 - 99 mg/dL   BUN 11 6 - 20 mg/dL   Creatinine, Ser 4.690.77 0.57 - 1.00 mg/dL   GFR calc non Af Amer 100 >59 mL/min/1.73   GFR calc Af Amer 115 >59 mL/min/1.73   BUN/Creatinine Ratio 14 9 - 23   Sodium 141 134 - 144 mmol/L   Potassium 4.8 3.5 - 5.2 mmol/L   Chloride 102 96 - 106 mmol/L   CO2 24 18 - 29 mmol/L   Calcium 9.3 8.7 - 10.2 mg/dL  Total Protein 6.5 6.0 - 8.5 g/dL   Albumin 3.9 3.5 - 5.5 g/dL   Globulin, Total 2.6 1.5 - 4.5 g/dL   Albumin/Globulin Ratio 1.5 1.2 - 2.2   Bilirubin Total 0.4 0.0 - 1.2 mg/dL   Alkaline Phosphatase 71 39 - 117 IU/L   AST 20 0 - 40 IU/L   ALT 20 0 - 32 IU/L  Lipid Panel w/o Chol/HDL Ratio  Result Value Ref Range   Cholesterol, Total 121 100 - 199 mg/dL   Triglycerides 88 0 - 149 mg/dL   HDL 39 (L) >98 mg/dL   VLDL Cholesterol Cal 18 5 - 40 mg/dL   LDL Calculated 64 0 - 99 mg/dL  Bayer DCA Hb J1B Waived  Result Value Ref Range   Bayer DCA Hb A1c Waived 5.2 <7.0 %  TSH  Result Value Ref Range   TSH 1.210 0.450 - 4.500 uIU/mL  VITAMIN D 25 Hydroxy (Vit-D Deficiency, Fractures)  Result Value Ref Range   Vit D, 25-Hydroxy 28.3 (L) 30.0 - 100.0 ng/mL  IGP, Aptima HPV, rfx 16/18,45  Result Value Ref Range   DIAGNOSIS: Comment    Specimen adequacy: Comment    CLINICIAN PROVIDED ICD10: Comment    Performed by: Comment    PAP SMEAR COMMENT .    Note: Comment    Test Methodology Comment    HPV Aptima Negative Negative      Assessment & Plan:   Problem List Items Addressed This Visit    None    Visit Diagnoses    Amenorrhea    -  Primary   Relevant Orders   Pregnancy, urine       Follow up plan: No Follow-up on file.

## 2016-11-01 NOTE — Progress Notes (Signed)
BP 100/62 (BP Location: Left Arm, Patient Position: Sitting, Cuff Size: Large)   Pulse (!) 59   Temp 98.4 F (36.9 C)   Ht 5' 6.7" (1.694 m)   Wt 227 lb (103 kg)   SpO2 99%   BMI 35.87 kg/m    Subjective:    Patient ID: Audrey Wolfe, female    DOB: 07/19/1981, 36 y.o.   MRN: [id number]  HPI: Audrey Wolfe is a 36 y.o. female  Chief Complaint  Patient presents with  . Depression   Depression Pt doing well with present medications.   Depression screen PHQ 2/9 05/29/2016  Decreased Interest 0  Down, Depressed, Hopeless 0  PHQ - 2 Score 0   URI   This is a new problem. Episode onset: 3 days. The problem has been gradually worsening. There has been no fever. Associated symptoms include congestion, headaches, rhinorrhea and a sore throat. Pertinent negatives include no sinus pain or wheezing. She has tried decongestant for the symptoms. The treatment provided mild relief.      Relevant past medical, surgical, family and social history reviewed and updated as indicated. Interim medical history since our last visit reviewed. Allergies and medications reviewed and updated.  Review of Systems  HENT: Positive for congestion, rhinorrhea and sore throat. Negative for sinus pain.   Respiratory: Negative for wheezing.   Neurological: Positive for headaches.    Per HPI unless specifically indicated above     Objective:    BP 100/62 (BP Location: Left Arm, Patient Position: Sitting, Cuff Size: Large)   Pulse (!) 59   Temp 98.4 F (36.9 C)   Ht 5' 6.7" (1.694 m)   Wt 227 lb (103 kg)   SpO2 99%   BMI 35.87 kg/m   Wt Readings from Last 3 Encounters:  11/01/16 227 lb (103 kg)  05/29/16 244 lb 3.2 oz (110.8 kg)  11/30/15 (!) 306 lb (138.8 kg)    Physical Exam  Constitutional: She is oriented to person, place, and time. She appears well-developed and well-nourished. No distress.  HENT:  Head: Normocephalic and atraumatic.  Right Ear: Tympanic membrane and ear canal normal.    Left Ear: Tympanic membrane and ear canal normal.  Nose: Rhinorrhea present. Right sinus exhibits no maxillary sinus tenderness and no frontal sinus tenderness. Left sinus exhibits no maxillary sinus tenderness and no frontal sinus tenderness.  Mouth/Throat: Mucous membranes are normal. Posterior oropharyngeal erythema present.  Eyes: Conjunctivae and lids are normal. Right eye exhibits no discharge. Left eye exhibits no discharge. No scleral icterus.  Cardiovascular: Normal rate and regular rhythm.   Pulmonary/Chest: Effort normal and breath sounds normal. No respiratory distress.  Abdominal: Normal appearance. There is no splenomegaly or hepatomegaly.  Musculoskeletal: Normal range of motion.  Neurological: She is alert and oriented to person, place, and time.  Skin: Skin is intact. No rash noted. No pallor.  Psychiatric: She has a normal mood and affect. Her behavior is normal. Judgment and thought content normal.    Results for orders placed or performed in visit on 05/29/16  CBC with Differential/Platelet  Result Value Ref Range   WBC 6.3 3.4 - 10.8 x10E3/uL   RBC 4.57 3.77 - 5.28 x10E6/uL   Hemoglobin 12.7 11.1 - 15.9 g/dL   Hematocrit 40.939.2 81.134.0 - 46.6 %   MCV 86 79 - 97 fL   MCH 27.8 26.6 - 33.0 pg   MCHC 32.4 31.5 - 35.7 g/dL   RDW 91.414.4 78.212.3 - 95.615.4 %  Platelets 242 150 - 379 x10E3/uL   Neutrophils 63 %   Lymphs 28 %   Monocytes 6 %   Eos 3 %   Basos 0 %   Neutrophils Absolute 4.0 1.4 - 7.0 x10E3/uL   Lymphocytes Absolute 1.8 0.7 - 3.1 x10E3/uL   Monocytes Absolute 0.4 0.1 - 0.9 x10E3/uL   EOS (ABSOLUTE) 0.2 0.0 - 0.4 x10E3/uL   Basophils Absolute 0.0 0.0 - 0.2 x10E3/uL   Immature Granulocytes 0 %   Immature Grans (Abs) 0.0 0.0 - 0.1 x10E3/uL  Comprehensive metabolic panel  Result Value Ref Range   Glucose 85 65 - 99 mg/dL   BUN 11 6 - 20 mg/dL   Creatinine, Ser 1.61 0.57 - 1.00 mg/dL   GFR calc non Af Amer 100 >59 mL/min/1.73   GFR calc Af Amer 115 >59  mL/min/1.73   BUN/Creatinine Ratio 14 9 - 23   Sodium 141 134 - 144 mmol/L   Potassium 4.8 3.5 - 5.2 mmol/L   Chloride 102 96 - 106 mmol/L   CO2 24 18 - 29 mmol/L   Calcium 9.3 8.7 - 10.2 mg/dL   Total Protein 6.5 6.0 - 8.5 g/dL   Albumin 3.9 3.5 - 5.5 g/dL   Globulin, Total 2.6 1.5 - 4.5 g/dL   Albumin/Globulin Ratio 1.5 1.2 - 2.2   Bilirubin Total 0.4 0.0 - 1.2 mg/dL   Alkaline Phosphatase 71 39 - 117 IU/L   AST 20 0 - 40 IU/L   ALT 20 0 - 32 IU/L  Lipid Panel w/o Chol/HDL Ratio  Result Value Ref Range   Cholesterol, Total 121 100 - 199 mg/dL   Triglycerides 88 0 - 149 mg/dL   HDL 39 (L) >09 mg/dL   VLDL Cholesterol Cal 18 5 - 40 mg/dL   LDL Calculated 64 0 - 99 mg/dL  Bayer DCA Hb U0A Waived  Result Value Ref Range   Bayer DCA Hb A1c Waived 5.2 <7.0 %  TSH  Result Value Ref Range   TSH 1.210 0.450 - 4.500 uIU/mL  VITAMIN D 25 Hydroxy (Vit-D Deficiency, Fractures)  Result Value Ref Range   Vit D, 25-Hydroxy 28.3 (L) 30.0 - 100.0 ng/mL  IGP, Aptima HPV, rfx 16/18,45  Result Value Ref Range   DIAGNOSIS: Comment    Specimen adequacy: Comment    CLINICIAN PROVIDED ICD10: Comment    Performed by: Comment    PAP SMEAR COMMENT .    Note: Comment    Test Methodology Comment    HPV Aptima Negative Negative      Assessment & Plan:   Problem List Items Addressed This Visit      Unprioritized   Anxiety and depression    Stable, continue present medications.         Other Visit Diagnoses    Amenorrhea    -  Primary   Relevant Orders   Pregnancy, urine   Acute nasopharyngitis           Follow up plan: Return in about 6 months (around 05/01/2017), or if symptoms worsen or fail to improve, for physical.

## 2016-11-01 NOTE — Assessment & Plan Note (Signed)
Stable, continue present medications.   

## 2016-12-02 ENCOUNTER — Encounter: Payer: Self-pay | Admitting: Intensive Care

## 2016-12-02 ENCOUNTER — Emergency Department
Admission: EM | Admit: 2016-12-02 | Discharge: 2016-12-02 | Disposition: A | Discharge status: Home / Self Care | Payer: Managed Care, Other (non HMO) | Attending: Emergency Medicine | Admitting: Emergency Medicine

## 2016-12-02 ENCOUNTER — Emergency Department: Payer: Managed Care, Other (non HMO)

## 2016-12-02 DIAGNOSIS — Y999 Unspecified external cause status: Secondary | ICD-10-CM | POA: Insufficient documentation

## 2016-12-02 DIAGNOSIS — Y939 Activity, unspecified: Secondary | ICD-10-CM | POA: Diagnosis not present

## 2016-12-02 DIAGNOSIS — Y9259 Other trade areas as the place of occurrence of the external cause: Secondary | ICD-10-CM | POA: Diagnosis not present

## 2016-12-02 DIAGNOSIS — S0990XA Unspecified injury of head, initial encounter: Secondary | ICD-10-CM | POA: Diagnosis not present

## 2016-12-02 DIAGNOSIS — Z791 Long term (current) use of non-steroidal anti-inflammatories (NSAID): Secondary | ICD-10-CM | POA: Insufficient documentation

## 2016-12-02 DIAGNOSIS — F0781 Postconcussional syndrome: Secondary | ICD-10-CM | POA: Diagnosis not present

## 2016-12-02 DIAGNOSIS — W1809XA Striking against other object with subsequent fall, initial encounter: Secondary | ICD-10-CM | POA: Insufficient documentation

## 2016-12-02 DIAGNOSIS — R42 Dizziness and giddiness: Secondary | ICD-10-CM

## 2016-12-02 LAB — GLUCOSE, CAPILLARY
GLUCOSE-CAPILLARY: 66 mg/dL (ref 65–99)
GLUCOSE-CAPILLARY: 74 mg/dL (ref 65–99)

## 2016-12-02 LAB — CBC
HEMATOCRIT: 39 % (ref 35.0–47.0)
HEMOGLOBIN: 13.4 g/dL (ref 12.0–16.0)
MCH: 28.7 pg (ref 26.0–34.0)
MCHC: 34.3 g/dL (ref 32.0–36.0)
MCV: 83.6 fL (ref 80.0–100.0)
Platelets: 192 10*3/uL (ref 150–440)
RBC: 4.67 MIL/uL (ref 3.80–5.20)
RDW: 14.3 % (ref 11.5–14.5)
WBC: 6 10*3/uL (ref 3.6–11.0)

## 2016-12-02 LAB — BASIC METABOLIC PANEL
ANION GAP: 8 (ref 5–15)
BUN: 13 mg/dL (ref 6–20)
CO2: 25 mmol/L (ref 22–32)
Calcium: 8.6 mg/dL — ABNORMAL LOW (ref 8.9–10.3)
Chloride: 106 mmol/L (ref 101–111)
Creatinine, Ser: 0.75 mg/dL (ref 0.44–1.00)
GFR calc Af Amer: >60 mL/min (ref >60)
GFR calc non Af Amer: >60 mL/min (ref >60)
GLUCOSE: 83 mg/dL (ref 65–99)
POTASSIUM: 4.2 mmol/L (ref 3.5–5.1)
Sodium: 139 mmol/L (ref 135–145)

## 2016-12-02 LAB — URINALYSIS, COMPLETE (UACMP) WITH MICROSCOPIC
BACTERIA UA: NONE SEEN
BILIRUBIN URINE: NEGATIVE
Glucose, UA: NEGATIVE mg/dL
Hgb urine dipstick: NEGATIVE
Ketones, ur: NEGATIVE mg/dL
Leukocytes, UA: NEGATIVE
Nitrite: NEGATIVE
Protein, ur: NEGATIVE mg/dL
RBC / HPF: NONE SEEN RBC/hpf (ref 0–5)
SPECIFIC GRAVITY, URINE: 1.002 — AB (ref 1.005–1.030)
WBC UA: NONE SEEN WBC/hpf (ref 0–5)
pH: 7 (ref 5.0–8.0)

## 2016-12-02 LAB — POCT PREGNANCY, URINE: PREG TEST UR: NEGATIVE

## 2016-12-02 NOTE — Discharge Instructions (Signed)

## 2016-12-02 NOTE — ED Triage Notes (Signed)
Patient presents to ER with c/o hitting her head on a hook 11/15/16. 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 Pt states "since hitting my head on the 19th I have had these seizures multiple times and I can remember/ see everything that is happening but cannot control my body. I have fallen multiple times and do not know why this is happening" Denies LOC when falling. Pt reports also having a rash on her breast and stomach area X10 days. Denies emesis. Reports some nausea this afternoon. Pt reports taking fluoxetine for anxiety.

## 2016-12-02 NOTE — ED Notes (Signed)
Pt verbalizes understanding of discharge instructions.
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
## 2017-01-23 ENCOUNTER — Other Ambulatory Visit: Payer: Self-pay | Admitting: Unknown Physician Specialty

## 2017-01-30 ENCOUNTER — Ambulatory Visit (INDEPENDENT_AMBULATORY_CARE_PROVIDER_SITE_OTHER): Payer: Managed Care, Other (non HMO)

## 2017-01-30 DIAGNOSIS — Z3042 Encounter for surveillance of injectable contraceptive: Secondary | ICD-10-CM | POA: Diagnosis not present

## 2017-02-16 IMAGING — CT CT HEAD W/O CM
3 series · 16 of 47 positions shown, 19 images · non-contrast
Comparison: None.

CLINICAL DATA: Right-sided headache after striking head into the
right temporal area about 3 weeks ago. Possible loss of
consciousness at that time.

EXAM:
CT HEAD WITHOUT CONTRAST
TECHNIQUE: Contiguous axial images were obtained from the base of the skull
through the vertex without intravenous contrast.

[Series 2: head wo · axial · 0.47mm/px · z∈[-190,-65]mm · 10 of 31 slices shown, 13 images]
[im 3/31  brain]
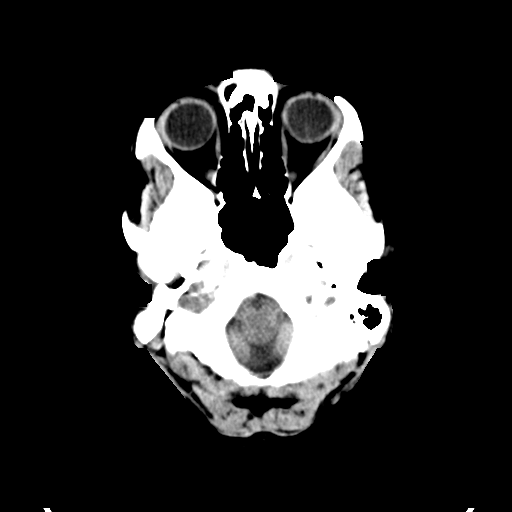
[im 3/31  bone]
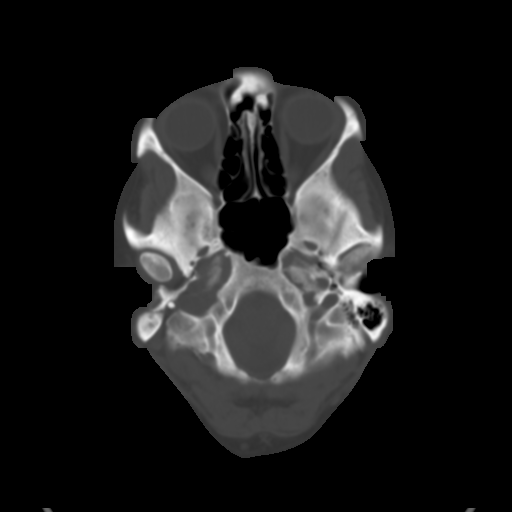
[im 6/31  brain]
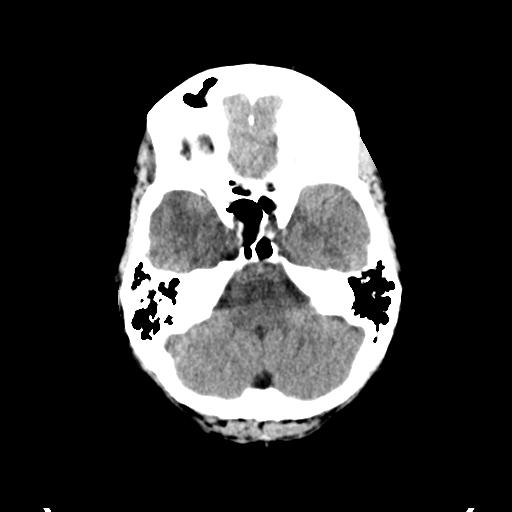
[im 9/31  brain]
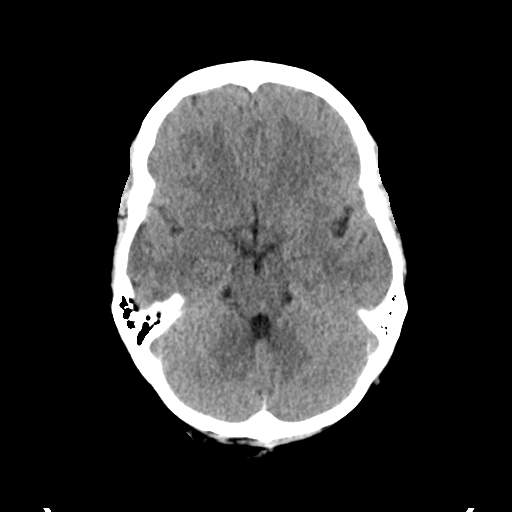
[im 11/31  brain]
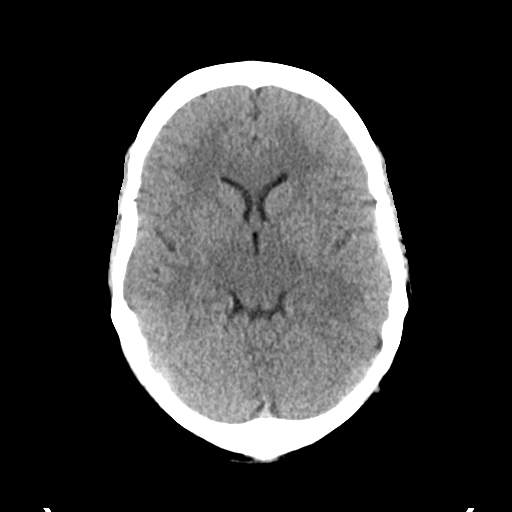
[im 14/31  brain]
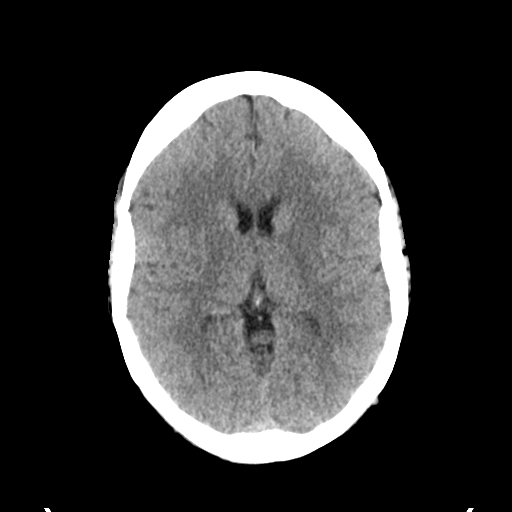
[im 14/31  bone]
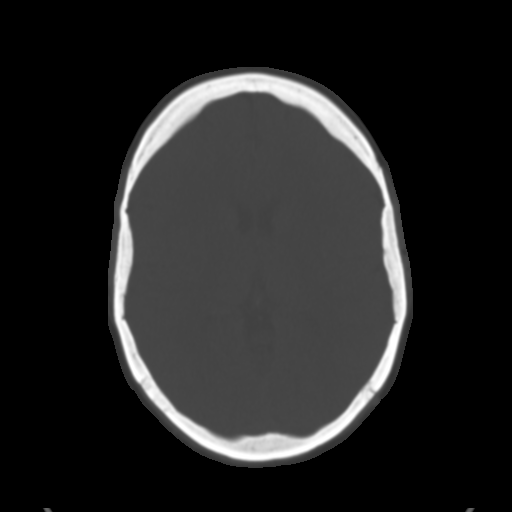
[im 17/31  brain]
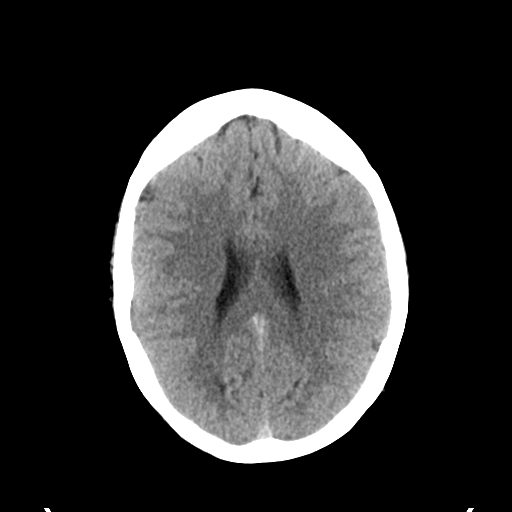
[im 20/31  brain]
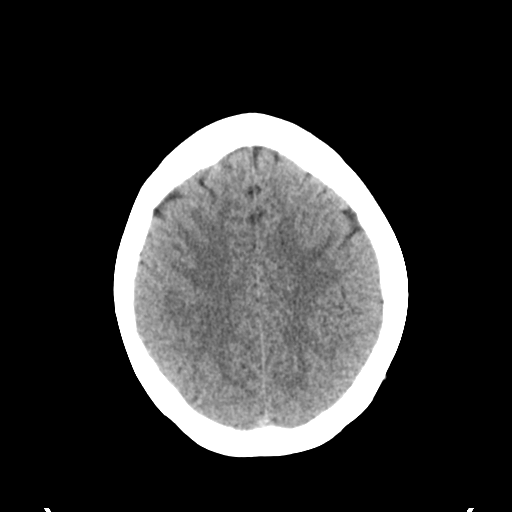
[im 23/31  brain]
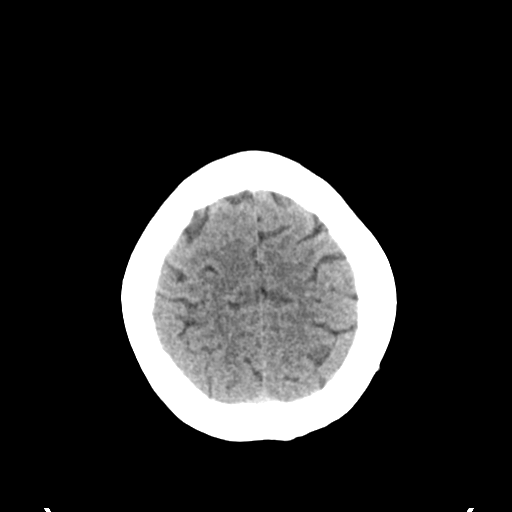
[im 25/31  brain]
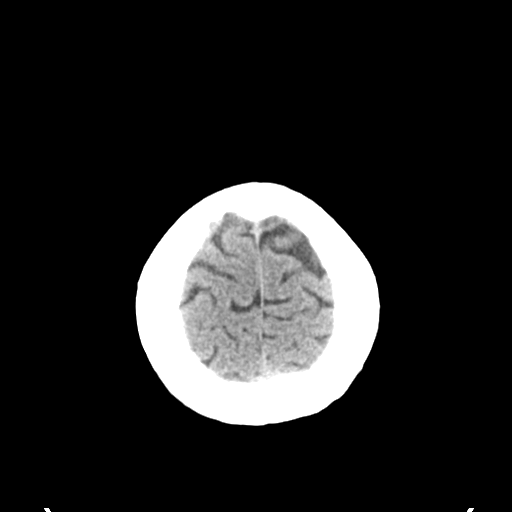
[im 25/31  bone]
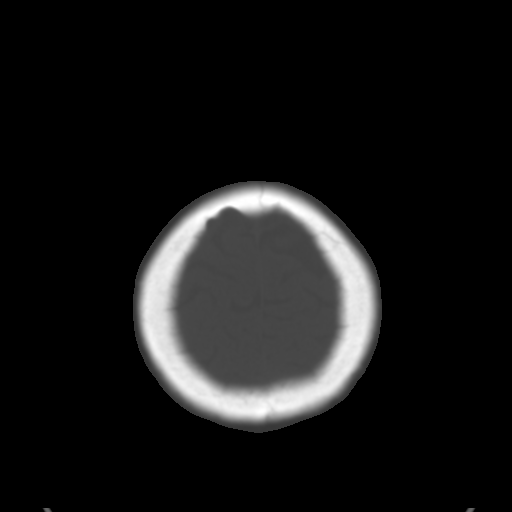
[im 28/31  brain]
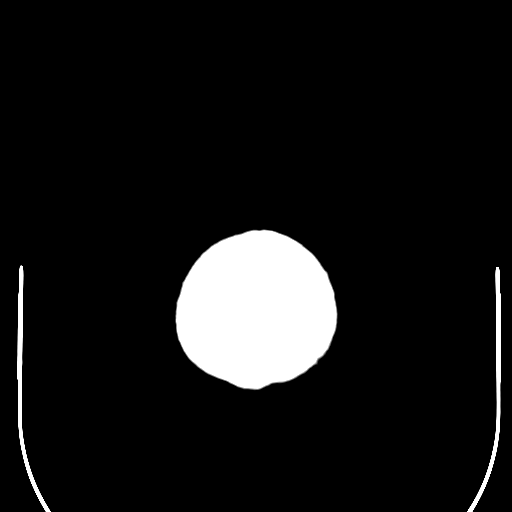

[Series 4: coronal soft tissue · coronal · 0.30mm/px · 3 of 67 slices shown]
[im 23/67  brain]
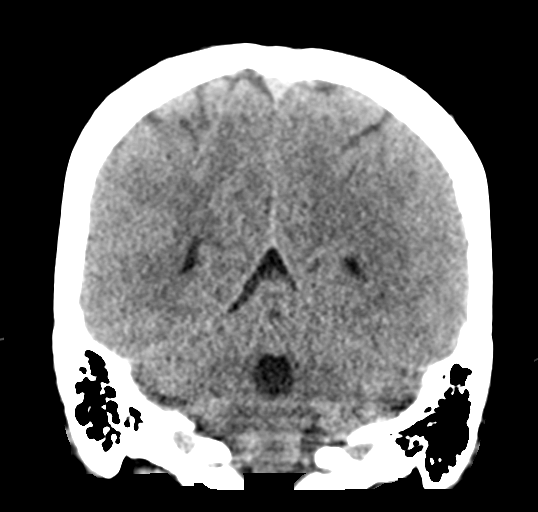
[im 30/67  brain]
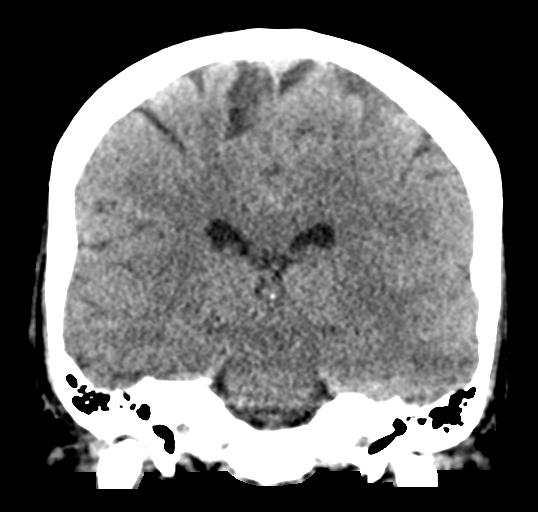
[im 37/67  brain]
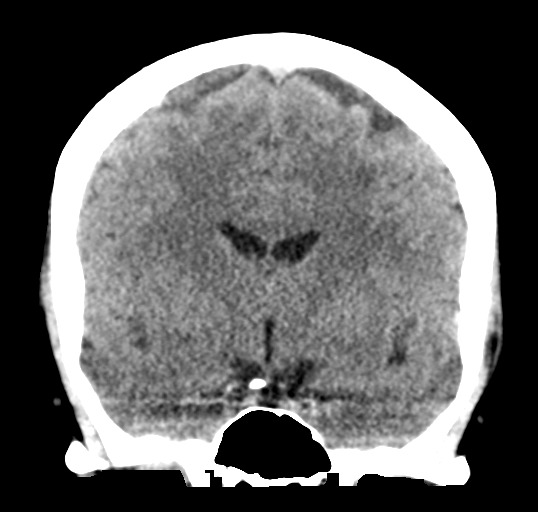

[Series 5: sagittal soft tissue · sagittal · 0.29mm/px · 3 of 49 slices shown]
[im 17/49  brain]
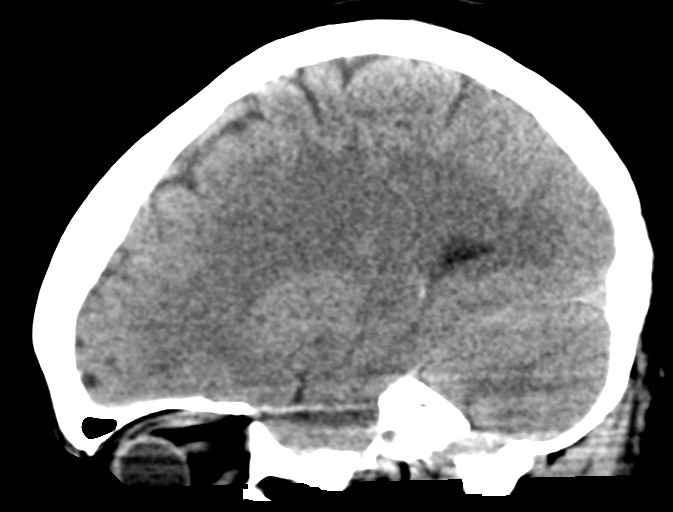
[im 25/49  brain]
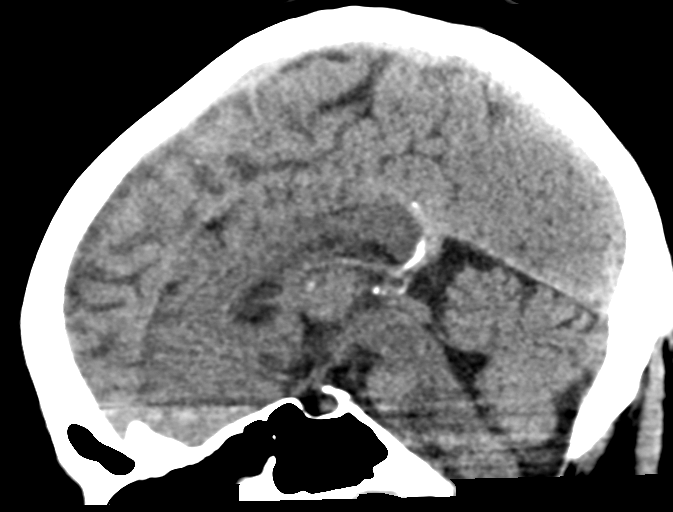
[im 33/49  brain]
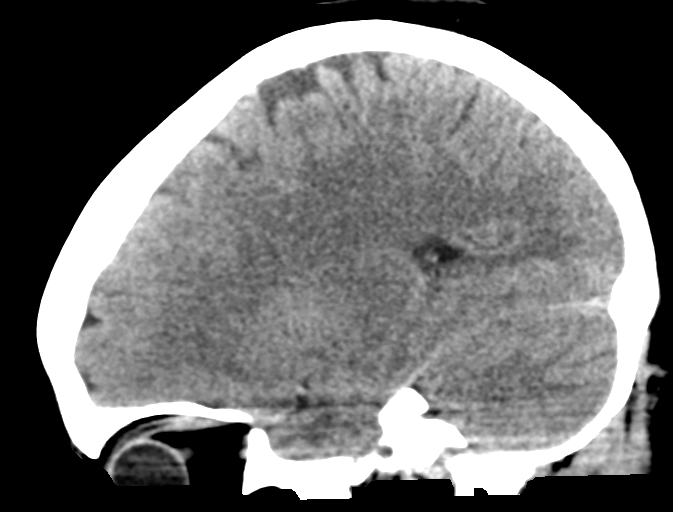

[16 of 47 positions shown; findings below may reference images not displayed]

FINDINGS: Brain: No evidence of acute infarction, hemorrhage, hydrocephalus,
extra-axial collection or mass lesion/mass effect.

Vascular: No hyperdense vessel or unexpected calcification.

Skull: Normal. Negative for fracture or focal lesion.

Sinuses/Orbits: No acute finding.

Other: None.
IMPRESSION: No acute intracranial abnormalities.

## 2017-05-14 ENCOUNTER — Telehealth: Payer: Self-pay | Admitting: Unknown Physician Specialty

## 2017-05-14 NOTE — Telephone Encounter (Signed)
Called and left patient a VM (pt gave permission to do this according to call documentation from front office) letting her know when her injection was due. I let her know that she would need to leave a urine sample before her next injection. Asked for patient to give us a call to schedule this or to stop by the office.

## 2017-05-14 NOTE — Telephone Encounter (Signed)
Patient is unsure when she gets her depo shot. Patient would like someone to call her in regards to when she needs her depo shot.  I can call patient back to inform her and schedule appointment if needed.   Please Advise.  Thank you

## 2017-05-14 NOTE — Telephone Encounter (Signed)
Patient is overdue for depo injection, she was due between 04/17/17 and 05/01/17. She will need to have a urine done before next injection. This can be scheduled on the nurse schedule. Will call patient and let her know.

## 2017-06-02 ENCOUNTER — Encounter: Payer: Self-pay | Admitting: Unknown Physician Specialty

## 2017-07-09 ENCOUNTER — Encounter: Payer: Self-pay | Admitting: Family Medicine

## 2017-07-24 ENCOUNTER — Encounter: Payer: Self-pay | Admitting: Family Medicine

## 2017-08-13 ENCOUNTER — Ambulatory Visit (INDEPENDENT_AMBULATORY_CARE_PROVIDER_SITE_OTHER): Payer: Managed Care, Other (non HMO) | Admitting: Family Medicine

## 2017-08-13 ENCOUNTER — Encounter: Payer: Self-pay | Admitting: Family Medicine

## 2017-08-13 VITALS — BP 105/71 | HR 63 | Temp 98.4°F | Ht 66.0 in | Wt 230.0 lb

## 2017-08-13 DIAGNOSIS — Z0000 Encounter for general adult medical examination without abnormal findings: Secondary | ICD-10-CM

## 2017-08-13 DIAGNOSIS — Z308 Encounter for other contraceptive management: Secondary | ICD-10-CM

## 2017-08-13 DIAGNOSIS — F32A Depression, unspecified: Secondary | ICD-10-CM

## 2017-08-13 DIAGNOSIS — L987 Excessive and redundant skin and subcutaneous tissue: Secondary | ICD-10-CM

## 2017-08-13 DIAGNOSIS — F329 Major depressive disorder, single episode, unspecified: Secondary | ICD-10-CM

## 2017-08-13 DIAGNOSIS — F419 Anxiety disorder, unspecified: Secondary | ICD-10-CM | POA: Diagnosis not present

## 2017-08-13 LAB — PREGNANCY, URINE: PREG TEST UR: NEGATIVE

## 2017-08-13 MED ORDER — MEDROXYPROGESTERONE ACETATE 150 MG/ML IM SUSP
150.0000 mg | Freq: Once | INTRAMUSCULAR | Status: AC
  Administered 2017-08-13: 150 mg via INTRAMUSCULAR

## 2017-08-13 NOTE — Progress Notes (Signed)
BP 105/71   Pulse 63   Temp 98.4 F (36.9 C)   Ht 5\' 6"  (1.676 m)   Wt 230 lb (104.3 kg)   SpO2 98%   BMI 37.12 kg/m    Subjective:    Patient ID: Audrey Wolfe, female    DOB: 01-02-1981, 36 y.o.   MRN: [id number]  HPI: Audrey Wolfe is a 36 y.o. female presenting on 08/13/2017 for comprehensive medical examination. Current medical complaints include:wanting referral to plastic surgery to help reduce her loose skin burden s/p bariatric surgery and significant weight loss. Getting yeast infections in skin folds frequently if she doesn't powder areas several times daily.   Moods under great control with prozac. Taking faithfully without side effects. Denies SI/HI.   Depression Screen done today and results listed below:  Depression screen [facility] 2/9 08/13/2017 05/29/2016  Decreased Interest 0 0  Down, Depressed, Hopeless 0 0  PHQ - 2 Score 0 0    The patient does not have a history of falls. I did not complete a risk assessment for falls. A plan of care for falls was not documented.   Past Medical History:  Past Medical History:  Diagnosis Date  . Abnormal glucose   . Anxiety   . Depression   . Obesity   . Seizures (HCC)    stress seizure  . Sleep apnea     Surgical History:  Past Surgical History:  Procedure Laterality Date  . GASTRIC ROUX-EN-Y N/A 10/03/2015   Procedure: LAPAROSCOPIC ROUX-EN-Y GASTRIC;  Surgeon: Geoffry Paradise, MD;  Location: [facility] ORS;  Service: General;  Laterality: N/A;    Medications:  Current Outpatient Prescriptions on File Prior to Visit  Medication Sig  . acetaminophen (TYLENOL) 500 MG tablet Take 1,000 mg by mouth every 6 (six) hours as needed.  Marland Kitchen FLUoxetine (PROZAC) 20 MG capsule TAKE ONE CAPSULE BY MOUTH EVERY DAY.  . medroxyPROGESTERone (DEPO-PROVERA) 150 MG/ML injection Inject 150 mg into the muscle every 3 (three) months.  Marland Kitchen UNABLE TO FIND 3 (three) times daily. Bariatric Advantage - Ultra Multi Formula with iron   Current  Facility-Administered Medications on File Prior to Visit  Medication  . medroxyPROGESTERone (DEPO-PROVERA) injection 150 mg    Allergies:  Allergies  Allergen Reactions  . Penicillins Rash    Social History:  Social History   Social History  . Marital status: Married    Spouse name: N/A  . Number of children: N/A  . Years of education: N/A   Occupational History  . Not on file.   Social History Main Topics  . Smoking status: Never Smoker  . Smokeless tobacco: Never Used  . Alcohol use No  . Drug use: No  . Sexual activity: Yes    Birth control/ protection: Injection   Other Topics Concern  . Not on file   Social History Narrative  . No narrative on file   History  Smoking Status  . Never Smoker  Smokeless Tobacco  . Never Used   History  Alcohol Use No    Family History:  Family History  Problem Relation Age of Onset  . Diabetes Father   . Asthma Daughter   . Stroke Maternal Grandmother   . Atrial fibrillation Maternal Grandmother   . Heart murmur Maternal Grandmother   . Cancer Maternal Grandfather        liver  . Alcohol abuse Paternal Grandfather     Past medical history, surgical history, medications, allergies, family history and social history  reviewed with patient today and changes made to appropriate areas of the chart.   Review of Systems - General ROS: negative Psychological ROS: negative Ophthalmic ROS: negative ENT ROS: negative Breast ROS: negative for breast lumps Respiratory ROS: no cough, shortness of breath, or wheezing Cardiovascular ROS: no chest pain or dyspnea on exertion Gastrointestinal ROS: no abdominal pain, change in bowel habits, or black or bloody stools Genito-Urinary ROS: no dysuria, trouble voiding, or hematuria Musculoskeletal ROS: negative Neurological ROS: no TIA or stroke symptoms Dermatological ROS: positive for loose skin, intertriginous infections All other ROS negative except what is listed above and in  the HPI.      Objective:    BP 105/71   Pulse 63   Temp 98.4 F (36.9 C)   Ht 5\' 6"  (1.676 m)   Wt 230 lb (104.3 kg)   SpO2 98%   BMI 37.12 kg/m   Wt Readings from Last 3 Encounters:  08/13/17 230 lb (104.3 kg)  12/02/16 215 lb (97.5 kg)  11/01/16 227 lb (103 kg)    Physical Exam  Constitutional: She is oriented to person, place, and time. She appears well-developed and well-nourished. No distress.  HENT:  Head: Atraumatic.  Right Ear: External ear normal.  Left Ear: External ear normal.  Nose: Nose normal.  Mouth/Throat: Oropharynx is clear and moist. No oropharyngeal exudate.  Eyes: Pupils are equal, round, and reactive to light. Conjunctivae are normal. No scleral icterus.  Neck: Normal range of motion. Neck supple. No thyromegaly present.  Cardiovascular: Normal rate, regular rhythm, normal heart sounds and intact distal pulses.   Pulmonary/Chest: Effort normal and breath sounds normal. No respiratory distress.  Abdominal: Soft. Bowel sounds are normal. She exhibits no mass. There is no tenderness.  Musculoskeletal: Normal range of motion. She exhibits no edema or tenderness.  Lymphadenopathy:    She has no cervical adenopathy.  Neurological: She is alert and oriented to person, place, and time. No cranial nerve deficit.  Skin: Skin is warm and dry. Rash (very mild candidal infection underneath breasts and skin folds of lower abdomen) noted.  Psychiatric: She has a normal mood and affect. Her behavior is normal.  Nursing note and vitals reviewed.   Results for orders placed or performed in visit on 08/13/17  Microscopic Examination  Result Value Ref Range   WBC, UA 6-10 (A) 0 - 5 /hpf   RBC, UA None seen 0 - 2 /hpf   Epithelial Cells (non renal) 0-10 0 - 10 /hpf   Bacteria, UA Few None seen/Few   Yeast, UA Present (A) None seen  Urine Culture, Reflex  Result Value Ref Range   Urine Culture, Routine Final report    Organism ID, Bacteria Comment   CBC with  Differential/Platelet  Result Value Ref Range   WBC 6.7 3.4 - 10.8 x10E3/uL   RBC 4.44 3.77 - 5.28 x10E6/uL   Hemoglobin 12.4 11.1 - 15.9 g/dL   Hematocrit 40.936.7 81.134.0 - 46.6 %   MCV 83 79 - 97 fL   MCH 27.9 26.6 - 33.0 pg   MCHC 33.8 31.5 - 35.7 g/dL   RDW 91.415.2 78.212.3 - 95.615.4 %   Platelets 233 150 - 379 x10E3/uL   Neutrophils 72 Not Estab. %   Lymphs 22 Not Estab. %   Monocytes 5 Not Estab. %   Eos 1 Not Estab. %   Basos 0 Not Estab. %   Neutrophils Absolute 4.8 1.4 - 7.0 x10E3/uL   Lymphocytes Absolute 1.5 0.7 -  3.1 x10E3/uL   Monocytes Absolute 0.3 0.1 - 0.9 x10E3/uL   EOS (ABSOLUTE) 0.1 0.0 - 0.4 x10E3/uL   Basophils Absolute 0.0 0.0 - 0.2 x10E3/uL   Immature Granulocytes 0 Not Estab. %   Immature Grans (Abs) 0.0 0.0 - 0.1 x10E3/uL  Comprehensive metabolic panel  Result Value Ref Range   Glucose 85 65 - 99 mg/dL   BUN 14 6 - 20 mg/dL   Creatinine, Ser 1.61 0.57 - 1.00 mg/dL   GFR calc non Af Amer 107 >59 mL/min/1.73   GFR calc Af Amer 124 >59 mL/min/1.73   BUN/Creatinine Ratio 19 9 - 23   Sodium 141 134 - 144 mmol/L   Potassium 4.7 3.5 - 5.2 mmol/L   Chloride 101 96 - 106 mmol/L   CO2 26 20 - 29 mmol/L   Calcium 8.8 8.7 - 10.2 mg/dL   Total Protein 6.8 6.0 - 8.5 g/dL   Albumin 4.2 3.5 - 5.5 g/dL   Globulin, Total 2.6 1.5 - 4.5 g/dL   Albumin/Globulin Ratio 1.6 1.2 - 2.2   Bilirubin Total 0.3 0.0 - 1.2 mg/dL   Alkaline Phosphatase 71 39 - 117 IU/L   AST 22 0 - 40 IU/L   ALT 16 0 - 32 IU/L  Lipid Panel w/o Chol/HDL Ratio  Result Value Ref Range   Cholesterol, Total 146 100 - 199 mg/dL   Triglycerides 96 0 - 149 mg/dL   HDL 59 >09 mg/dL   VLDL Cholesterol Cal 19 5 - 40 mg/dL   LDL Calculated 68 0 - 99 mg/dL  TSH  Result Value Ref Range   TSH 0.757 0.450 - 4.500 uIU/mL  UA/M w/rflx Culture, Routine  Result Value Ref Range   Specific Gravity, UA 1.010 1.005 - 1.030   pH, UA 7.0 5.0 - 7.5   Color, UA Yellow Yellow   Appearance Ur Hazy (A) Clear   Leukocytes, UA 1+  (A) Negative   Protein, UA Negative Negative/Trace   Glucose, UA Negative Negative   Ketones, UA Negative Negative   RBC, UA Negative Negative   Bilirubin, UA Negative Negative   Urobilinogen, Ur 2.0 (H) 0.2 - 1.0 mg/dL   Nitrite, UA Negative Negative   Microscopic Examination See below:    Urinalysis Reflex Comment   Pregnancy, urine  Result Value Ref Range   Preg Test, Ur Negative Negative      Assessment & Plan:   Problem List Items Addressed This Visit      Other   Anxiety and depression - Primary    Under excellent control on prozac. Continue current regimen.        Other Visit Diagnoses    Annual physical exam       Relevant Orders   CBC with Differential/Platelet (Completed)   Comprehensive metabolic panel (Completed)   Lipid Panel w/o Chol/HDL Ratio (Completed)   TSH (Completed)   UA/M w/rflx Culture, Routine (Completed)   Loose skin       S/p successful bariatric surgery and long-term weight reduction. Getting frequent yeast infections in skin folds and wanting excess removed   Relevant Orders   Ambulatory referral to Plastic Surgery   Encounter for other contraceptive management       Depo injection given today. UPT negative.    Relevant Medications   medroxyPROGESTERone (DEPO-PROVERA) injection 150 mg (Completed)   Other Relevant Orders   Pregnancy, urine (Completed)       Follow up plan: Return in about 6 months (around 02/11/2018) for  Depression f/u.   LABORATORY TESTING:  - Pap smear: up to date  IMMUNIZATIONS:   - Tdap: Tetanus vaccination status reviewed: last tetanus booster within 10 years. - Influenza: Refused  PATIENT COUNSELING:   Advised to take 1 mg of folate supplement per day if capable of pregnancy.   Sexuality: Discussed sexually transmitted diseases, partner selection, use of condoms, avoidance of unintended pregnancy  and contraceptive alternatives.   Advised to avoid cigarette smoking.  I discussed with the patient that most  people either abstain from alcohol or drink within safe limits (<=14/week and <=4 drinks/occasion for males, <=7/weeks and <= 3 drinks/occasion for females) and that the risk for alcohol disorders and other health effects rises proportionally with the number of drinks per week and how often a drinker exceeds daily limits.  Discussed cessation/primary prevention of drug use and availability of treatment for abuse.   Diet: Encouraged to adjust caloric intake to maintain  or achieve ideal body weight, to reduce intake of dietary saturated fat and total fat, to limit sodium intake by avoiding high sodium foods and not adding table salt, and to maintain adequate dietary potassium and calcium preferably from fresh fruits, vegetables, and low-fat dairy products.    stressed the importance of regular exercise  Injury prevention: Discussed safety belts, safety helmets, smoke detector, smoking near bedding or upholstery.   Dental health: Discussed importance of regular tooth brushing, flossing, and dental visits.    NEXT PREVENTATIVE PHYSICAL DUE IN 1 YEAR. Return in about 6 months (around 02/11/2018) for Depression f/u.

## 2017-08-14 LAB — CBC WITH DIFFERENTIAL/PLATELET
BASOS: 0 %
Basophils Absolute: 0 10*3/uL (ref 0.0–0.2)
EOS (ABSOLUTE): 0.1 10*3/uL (ref 0.0–0.4)
EOS: 1 %
HEMATOCRIT: 36.7 % (ref 34.0–46.6)
HEMOGLOBIN: 12.4 g/dL (ref 11.1–15.9)
IMMATURE GRANS (ABS): 0 10*3/uL (ref 0.0–0.1)
IMMATURE GRANULOCYTES: 0 %
LYMPHS: 22 %
Lymphocytes Absolute: 1.5 10*3/uL (ref 0.7–3.1)
MCH: 27.9 pg (ref 26.6–33.0)
MCHC: 33.8 g/dL (ref 31.5–35.7)
MCV: 83 fL (ref 79–97)
MONOCYTES: 5 %
Monocytes Absolute: 0.3 10*3/uL (ref 0.1–0.9)
NEUTROS PCT: 72 %
Neutrophils Absolute: 4.8 10*3/uL (ref 1.4–7.0)
Platelets: 233 10*3/uL (ref 150–379)
RBC: 4.44 x10E6/uL (ref 3.77–5.28)
RDW: 15.2 % (ref 12.3–15.4)
WBC: 6.7 10*3/uL (ref 3.4–10.8)

## 2017-08-14 LAB — COMPREHENSIVE METABOLIC PANEL
A/G RATIO: 1.6 (ref 1.2–2.2)
ALT: 16 IU/L (ref 0–32)
AST: 22 IU/L (ref 0–40)
Albumin: 4.2 g/dL (ref 3.5–5.5)
Alkaline Phosphatase: 71 IU/L (ref 39–117)
BUN/Creatinine Ratio: 19 (ref 9–23)
BUN: 14 mg/dL (ref 6–20)
Bilirubin Total: 0.3 mg/dL (ref 0.0–1.2)
CALCIUM: 8.8 mg/dL (ref 8.7–10.2)
CO2: 26 mmol/L (ref 20–29)
CREATININE: 0.72 mg/dL (ref 0.57–1.00)
Chloride: 101 mmol/L (ref 96–106)
GFR, EST AFRICAN AMERICAN: 124 mL/min/{1.73_m2} (ref >59)
GFR, EST NON AFRICAN AMERICAN: 107 mL/min/{1.73_m2} (ref >59)
GLOBULIN, TOTAL: 2.6 g/dL (ref 1.5–4.5)
Glucose: 85 mg/dL (ref 65–99)
POTASSIUM: 4.7 mmol/L (ref 3.5–5.2)
SODIUM: 141 mmol/L (ref 134–144)
TOTAL PROTEIN: 6.8 g/dL (ref 6.0–8.5)

## 2017-08-14 LAB — TSH: TSH: 0.757 u[IU]/mL (ref 0.450–4.500)

## 2017-08-14 LAB — LIPID PANEL W/O CHOL/HDL RATIO
Cholesterol, Total: 146 mg/dL (ref 100–199)
HDL: 59 mg/dL (ref >39)
LDL Calculated: 68 mg/dL (ref 0–99)
Triglycerides: 96 mg/dL (ref 0–149)
VLDL Cholesterol Cal: 19 mg/dL (ref 5–40)

## 2017-08-15 LAB — MICROSCOPIC EXAMINATION: RBC, UA: NONE SEEN /hpf (ref 0–?)

## 2017-08-15 LAB — UA/M W/RFLX CULTURE, ROUTINE
BILIRUBIN UA: NEGATIVE
Glucose, UA: NEGATIVE
KETONES UA: NEGATIVE
NITRITE UA: NEGATIVE
PH UA: 7 (ref 5.0–7.5)
PROTEIN UA: NEGATIVE
RBC UA: NEGATIVE
Specific Gravity, UA: 1.01 (ref 1.005–1.030)
Urobilinogen, Ur: 2 mg/dL — ABNORMAL HIGH (ref 0.2–1.0)

## 2017-08-15 LAB — URINE CULTURE, REFLEX

## 2017-08-15 NOTE — Assessment & Plan Note (Signed)
Under excellent control on prozac. Continue current regimen.

## 2017-08-15 NOTE — Patient Instructions (Signed)
Follow up in 6 months 

## 2017-10-08 ENCOUNTER — Telehealth: Payer: Self-pay | Admitting: Unknown Physician Specialty

## 2017-10-08 NOTE — Telephone Encounter (Addendum)
Copied from CRM #[id number]. Topic: Quick Communication - See Telephone Encounter >> Oct 08, 2017 11:16 AM Trula SladeWalter, Linda F wrote: CRM for notification. See Telephone encounter for:  10/08/17. Patient stated she was in the office on 08/13/17 to see Roosvelt Maser[address] and had labs done but no one has called her back with the results.  She stated if you call and no one answers, please leave a detailed message on her cell phone voice mail.

## 2017-10-08 NOTE — Telephone Encounter (Signed)
Please Advise

## 2017-10-08 NOTE — Telephone Encounter (Signed)
Looked in patient's chart and saw a message that IrontonRachel sent to the patient regarding labs. Fleet ContrasRachel stated "labs are nice and normal, keep up the good work". Called and relayed this message to the patient.

## 2017-10-13 ENCOUNTER — Ambulatory Visit: Payer: Managed Care, Other (non HMO) | Admitting: Obstetrics and Gynecology

## 2017-10-13 ENCOUNTER — Encounter: Payer: Self-pay | Admitting: Obstetrics and Gynecology

## 2017-10-13 VITALS — BP 112/72 | HR 73 | Ht 67.0 in | Wt 231.0 lb

## 2017-10-13 DIAGNOSIS — N949 Unspecified condition associated with female genital organs and menstrual cycle: Secondary | ICD-10-CM

## 2017-10-13 DIAGNOSIS — N730 Acute parametritis and pelvic cellulitis: Secondary | ICD-10-CM

## 2017-10-13 DIAGNOSIS — R1030 Lower abdominal pain, unspecified: Secondary | ICD-10-CM | POA: Diagnosis not present

## 2017-10-13 LAB — POCT URINALYSIS DIPSTICK
BILIRUBIN UA: NEGATIVE
Blood, UA: NEGATIVE
GLUCOSE UA: NEGATIVE
Ketones, UA: NEGATIVE
Nitrite, UA: NEGATIVE
Spec Grav, UA: 1.02 (ref 1.010–1.025)
Urobilinogen, UA: 0.2 E.U./dL
pH, UA: 5 (ref 5.0–8.0)

## 2017-10-13 MED ORDER — METRONIDAZOLE 500 MG PO TABS: 500.0000 mg | ORAL_TABLET | Freq: Two times a day (BID) | ORAL | 0 refills | Status: AC

## 2017-10-13 MED ORDER — CEFTRIAXONE SODIUM 250 MG IJ SOLR
250.0000 mg | Freq: Once | INTRAMUSCULAR | Status: AC
  Administered 2017-10-13: 250 mg via INTRAMUSCULAR

## 2017-10-13 MED ORDER — DOXYCYCLINE HYCLATE 100 MG PO CAPS: 100.0000 mg | ORAL_CAPSULE | Freq: Two times a day (BID) | ORAL | 0 refills | Status: AC

## 2017-10-13 NOTE — Progress Notes (Signed)
Gynecology Pelvic Pain Evaluation   Chief Complaint:  Chief Complaint  Patient presents with  . Menstrual Problem    bleeding with depo provera and painful intercourse    History of Present Illness:   Patient is a 36 y.o. [id number] who LMP was Patient's last menstrual period was 08/28/2017 (exact date)., presents today for a problem visit.  She complains of dyspareunia. She says that the pain started about 2 months ago. The pain is severe. She has been unable to have intercourse. She reports that the pain is worse with penetration, but she is a uncertain of where exactly the pain is located. She does not experience pain at rest or day to day. She denies abnormal vaginal discharge or odor. She denies burning with urination or urinary frequency.  She has not had vaginal bleeding. She is has not previously been seen for this condition. She additionally reports that she has a history of an episiotomy and a second degree laceration in the past. Her last child was born 4 years ago.  She uses Depo Provera for contraception. She has been on this therapy for 10 years. She is generally amenorrheic, but occasionally will have a period. Discussed with patient considering a different birth control option as she approaches menopause so that her body has time to recover bone mineral density.  She will consider.    Patient desires cervical cancer screening today.   PMHx: She  has a past medical history of Abnormal glucose, Anxiety, Depression, Obesity, Seizures (HCC), and Sleep apnea. 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 she has never used smokeless tobacco.  She reports that she does not drink alcohol or use drugs.  She has a current medication list which includes the following prescription(s): acetaminophen, doxycycline, fluoxetine, medroxyprogesterone, metronidazole, and UNABLE TO FIND, and the following Facility-Administered Medications: medroxyprogesterone. Also, is allergic to penicillins.  Review of Systems  Constitutional: Negative for chills, fever, malaise/fatigue and weight loss.  HENT: Negative for congestion, hearing loss and sinus pain.   Eyes: Negative for blurred vision and double vision.  Respiratory: Negative for cough, sputum production, shortness of breath and wheezing.   Cardiovascular: Negative for chest pain, palpitations, orthopnea and leg swelling.  Gastrointestinal: Negative for abdominal pain, constipation, diarrhea, nausea and vomiting.  Genitourinary: Negative for dysuria, flank pain, frequency, hematuria and urgency.  Musculoskeletal: Negative for back pain, falls and joint pain.  Skin: Negative for itching and rash.  Neurological: Negative for dizziness and headaches.  Psychiatric/Behavioral: Negative for depression, substance abuse and suicidal ideas. The patient is not nervous/anxious.     Objective: BP 112/72   Pulse 73   Ht 5\' 7"  (1.702 m)   Wt 231 lb (104.8 kg)   LMP 08/28/2017 (Exact Date)   BMI 36.18 kg/m  Physical Exam  Constitutional: She is oriented to person, place, and time. She appears well-developed and well-nourished.  Genitourinary:  Genitourinary Comments: Reflexes intact. Normal sensation to dull and sharp. No tenderness in the vestibule. No tenderness at hymen or introitus.  Patient tolerated speculum insertion.  Patient has positive chandelier sign with manipulation of cervix. Tender to palpation over uterus. Cervical motion tenderness. No adnexal masses appreciated.  HENT:  Head:  Normocephalic and atraumatic.  Eyes: Conjunctivae and EOM are normal.  Neck: Normal range of motion.    Cardiovascular: Normal rate and regular rhythm.  Pulmonary/Chest: Effort normal and breath sounds normal.  Abdominal: Soft. She exhibits no distension and no mass. There is no tenderness. There is no guarding.  Neurological: She is alert and oriented to person, place, and time.  Skin: Skin is warm and dry.  Psychiatric: She has a normal mood and affect. Her behavior is normal.    Female chaperone present for pelvic portion of the physical exam  Assessment: 36 y.o. [id number] with Infectious: PID.  1. PID (acute pelvic inflammatory disease) Will sart patient on antibiotics. Recommended 2 weeks of pelvic rest while she completes this therapy.  - doxycycline (VIBRAMYCIN) 100 MG capsule; Take 1 capsule (100 mg total) by mouth 2 (two) times daily for 14 days.  Dispense: 28 capsule; Refill: 0 - metroNIDAZOLE (FLAGYL) 500 MG tablet; Take 1 tablet (500 mg total) by mouth 2 (two) times daily for 14 days.  Dispense: 28 tablet; Refill: 0 - cefTRIAXone (ROCEPHIN) injection 250 mg - NuSwab BV and Candida, NAA - Urine Culture  2. Screening for cervical cancer- - PapIG, CtNgTv, HPV, rfx 16/18  Problem List Items Addressed This Visit    None    Visit Diagnoses    Lower abdominal pain    -  Primary   Relevant Orders   POCT Urinalysis Dipstick (Completed)   PID (acute pelvic inflammatory disease)       Relevant Medications   doxycycline (VIBRAMYCIN) 100 MG capsule   metroNIDAZOLE (FLAGYL) 500 MG tablet   cefTRIAXone (ROCEPHIN) injection 250 mg (Completed)   Other Relevant Orders   PapIG, CtNgTv, HPV, rfx 16/18   NuSwab BV and Candida, NAA   Urine Culture   Cervical motion tenderness         Follow up in 1 month.  Adelene Idlerhristanna Tamyia Minich, MD [facility], [facility] 10/13/2017  8:52 PM

## 2017-10-13 NOTE — Patient Instructions (Signed)
Pelvic Inflammatory Disease °Pelvic inflammatory disease (PID) is an infection in some or all of the female organs. PID can be in the uterus, ovaries, fallopian tubes, or the surrounding tissues that are inside the lower belly area (pelvis). PID can lead to lasting problems if it is not treated. To check for this disease, your doctor may: °· Do a physical exam. °· Do blood tests, urine tests, or a pregnancy test. °· Look at your vaginal discharge. °· Do tests to look inside the pelvis. °· Test you for other infections. ° °Follow these instructions at home: °· Take over-the-counter and prescription medicines only as told by your doctor. °· If you were prescribed an antibiotic medicine, take it as told by your doctor. Do not stop taking it even if you start to feel better. °· Do not have sex until treatment is done or as told by your doctor. °· Tell your sex partner if you have PID. Your partner may need to be treated. °· Keep all follow-up visits as told by your doctor. This is important. °· Your doctor may test you for infection again 3 months after you are treated. °Contact a doctor if: °· You have more fluid (discharge) coming from your vagina or fluid that is not normal. °· Your pain does not improve. °· You throw up (vomit). °· You have a fever. °· You cannot take your medicines. °· Your partner has a sexually transmitted disease (STD). °· You have pain when you pee (urinate). °Get help right away if: °· You have more belly (abdominal) or lower belly pain. °· You have chills. °· You are not better after 72 hours. °This information is not intended to replace advice given to you by your health care provider. Make sure you discuss any questions you have with your health care provider. °Document Released: 01/10/2009 Document Revised: 03/21/2016 Document Reviewed: 11/21/2014 °Elsevier Interactive Patient Education © 2018 Elsevier Inc. ° °

## 2017-10-15 LAB — PAPIG, CTNGTV, HPV, RFX 16/18
Chlamydia, Nuc. Acid Amp: NEGATIVE
Gonococcus, Nuc. Acid Amp: NEGATIVE
HPV, HIGH-RISK: NEGATIVE
PAP Smear Comment: 0
Trich vag by NAA: NEGATIVE

## 2017-10-16 NOTE — Progress Notes (Signed)
Reviewed. Released to mychart.

## 2017-10-18 ENCOUNTER — Other Ambulatory Visit: Payer: Self-pay | Admitting: Obstetrics and Gynecology

## 2017-10-18 ENCOUNTER — Encounter: Payer: Self-pay | Admitting: Obstetrics and Gynecology

## 2017-10-18 DIAGNOSIS — B373 Candidiasis of vulva and vagina: Secondary | ICD-10-CM

## 2017-10-18 DIAGNOSIS — B3731 Acute candidiasis of vulva and vagina: Secondary | ICD-10-CM

## 2017-10-18 LAB — NUSWAB BV AND CANDIDA, NAA
Candida albicans, NAA: NEGATIVE
Candida glabrata, NAA: POSITIVE — AB

## 2017-10-18 LAB — URINE CULTURE

## 2017-10-18 MED ORDER — TERCONAZOLE 0.8 % VA CREA: 1.0000 | TOPICAL_CREAM | Freq: Every day | VAGINAL | 1 refills | Status: DC

## 2017-10-18 NOTE — Progress Notes (Signed)
Reviewed New swab, Called patient but directed to voicemail. Left message to check mychart. Will send rx to pharmacy for patient.

## 2017-10-18 NOTE — Progress Notes (Signed)
Sent rx for [employer] to pharmacy. Notified patient through MyChart.

## 2017-11-04 ENCOUNTER — Encounter: Payer: Self-pay | Admitting: Unknown Physician Specialty

## 2017-11-04 ENCOUNTER — Ambulatory Visit (INDEPENDENT_AMBULATORY_CARE_PROVIDER_SITE_OTHER): Payer: Managed Care, Other (non HMO) | Admitting: Unknown Physician Specialty

## 2017-11-04 VITALS — BP 117/69 | HR 93 | Temp 98.7°F | Wt 233.6 lb

## 2017-11-04 DIAGNOSIS — B3731 Acute candidiasis of vulva and vagina: Secondary | ICD-10-CM

## 2017-11-04 DIAGNOSIS — K140 Glossitis: Secondary | ICD-10-CM

## 2017-11-04 DIAGNOSIS — B370 Candidal stomatitis: Secondary | ICD-10-CM

## 2017-11-04 DIAGNOSIS — B373 Candidiasis of vulva and vagina: Secondary | ICD-10-CM

## 2017-11-04 MED ORDER — NYSTATIN 100000 UNIT/ML MT SUSP: 5.0000 mL | Freq: Four times a day (QID) | OROMUCOSAL | 0 refills | Status: DC

## 2017-11-04 MED ORDER — FLUCONAZOLE 150 MG PO TABS: 150.0000 mg | ORAL_TABLET | Freq: Once | ORAL | 0 refills | Status: AC

## 2017-11-04 NOTE — Patient Instructions (Addendum)
Oral Thrush, Adult Oral thrush, also called oral candidiasis, is a fungal infection that develops in the mouth and throat and on the tongue. It causes white patches to form on the mouth and tongue. Thrush is most common in older adults, but it can occur at any age. Many cases of thrush are mild, but this infection can also be serious. Thrush can be a repeated (recurrent) problem for certain people who have a weak body defense system (immune system). The weakness can be caused by chronic illnesses, or by taking medicines that limit the body's ability to fight infection. If a person has difficulty fighting infection, the fungus that causes thrush can spread through the body. This can cause life-threatening blood or organ infections. What are the causes? This condition is caused by a fungus (yeast) called Candida albicans.  This fungus is normally present in small amounts in the mouth and on other mucous membranes. It usually causes no harm.  If conditions are present that allow the fungus to grow without control, it invades surrounding tissues and becomes an infection.  Other Candida species can also lead to thrush (rare).  What increases the risk? This condition is more likely to develop in:  People with a weakened immune system.  Older adults.  People with HIV (human immunodeficiency virus).  People with diabetes.  People with dry mouth (xerostomia).  Pregnant women.  People with poor dental care, especially people who have false teeth.  People who use antibiotic medicines.  What are the signs or symptoms? Symptoms of this condition can vary from mild and moderate to severe and persistent. Symptoms may include:  A burning feeling in the mouth and throat. This can occur at the start of a thrush infection.  White patches that stick to the mouth and tongue. The tissue around the patches may be red, raw, and painful. If rubbed (during tooth brushing, for example), the patches and the  tissue of the mouth may bleed easily.  A bad taste in the mouth or difficulty tasting foods.  A cottony feeling in the mouth.  Pain during eating and swallowing.  Poor appetite.  Cracking at the corners of the mouth.  How is this diagnosed? This condition is diagnosed based on:  Physical exam. Your health care provider will look in your mouth.  Health history. Your health care provider will ask you questions about your health.  How is this treated? This condition is treated with medicines called antifungals, which prevent the growth of fungi. These medicines are either applied directly to the affected area (topical) or swallowed (oral). The treatment will depend on the severity of the condition. Mild thrush Mild cases of thrush may clear up with the use of an antifungal mouth rinse or lozenges. Treatment usually lasts about 14 days. Moderate to severe thrush  More severe thrush infections that have spread to the esophagus are treated with an oral antifungal medicine. A topical antifungal medicine may also be used.  For some severe infections, treatment may need to continue for more than 14 days.  Oral antifungal medicines are rarely used during pregnancy because they may be harmful to the unborn child. If you are pregnant, talk with your health care provider about options for treatment. Persistent or recurrent thrush For cases of thrush that do not go away or keep coming back:  Treatment may be needed twice as long as the symptoms last.  Treatment will include both oral and topical antifungal medicines.  People with a weakened immune   system can take an antifungal medicine on a continuous basis to prevent thrush infections.  It is important to treat conditions that make a person more likely to get thrush, such as diabetes or HIV. Follow these instructions at home: Medicines  Take over-the-counter and prescription medicines only as told by your health care provider.  Talk  with your health care provider about an over-the-counter medicine called gentian violet, which kills bacteria and fungi. Relieving soreness and discomfort To help reduce the discomfort of thrush:  Drink cold liquids such as water or iced tea.  Try flavored ice treats or frozen juices.  Eat foods that are easy to swallow, such as gelatin, ice cream, or custard.  Try drinking from a straw if the patches in your mouth are painful.  General instructions  Eat plain, unflavored yogurt as directed by your health care provider. Check the label to make sure the yogurt contains live cultures. This yogurt can help healthy bacteria to grow in the mouth and can stop the growth of the fungus that causes thrush.  If you wear dentures, remove the dentures before going to bed, brush them vigorously, and soak them in a cleaning solution as directed by your health care provider.  Rinse your mouth with a warm salt-water mixture several times a day. To make a salt-water mixture, completely dissolve 1/2-1 tsp of salt in 1 cup of warm water. Contact a health care provider if:  Your symptoms are getting worse or are not improving within 7 days of starting treatment.  You have symptoms of a spreading infection, such as white patches on the skin outside of the mouth. This information is not intended to replace advice given to you by your health care provider. Make sure you discuss any questions you have with your health care provider. Document Released: 07/09/2004 Document Revised: 07/08/2016 Document Reviewed: 07/08/2016 Elsevier Interactive Patient Education  2017 Elsevier Inc.  

## 2017-11-04 NOTE — Progress Notes (Signed)
BP 117/69   Pulse 93   Temp 98.7 F (37.1 C) (Oral)   Wt 233 lb 9.6 oz (106 kg)   SpO2 98%   BMI 36.59 kg/m    Subjective:    Patient ID: Audrey Wolfe, female    DOB: 07/01/1981, 37 y.o.   MRN: [id number]  HPI: Audrey Wolfe is a 37 y.o. female  Chief Complaint  Patient presents with  . Oral Swelling    pt states that her tongue has been swelling and has had spots on it. States she is currently on doxycyline and metronidazole and wonders if it is a side effect from the medicine   Pt with complaints of red, swollen tongue with a white coating.  States it is painful.  Is taking Doxycycline and Metronidazole.  These medications for PID was given on 12/17 and has not completed the course due to stomach issues.  Now has vaginal burning and itching.    Relevant past medical, surgical, family and social history reviewed and updated as indicated. Interim medical history since our last visit reviewed. Allergies and medications reviewed and updated.  Review of Systems  Per HPI unless specifically indicated above     Objective:    BP 117/69   Pulse 93   Temp 98.7 F (37.1 C) (Oral)   Wt 233 lb 9.6 oz (106 kg)   SpO2 98%   BMI 36.59 kg/m   Wt Readings from Last 3 Encounters:  11/04/17 233 lb 9.6 oz (106 kg)  10/13/17 231 lb (104.8 kg)  08/13/17 230 lb (104.3 kg)    Physical Exam  Constitutional: She is oriented to person, place, and time. She appears well-developed and well-nourished. No distress.  HENT:  Head: Normocephalic and atraumatic.  Mouth/Throat: Uvula is midline.  Pt with white coating on tongue.  Tender and swollen  Eyes: Conjunctivae and lids are normal. Right eye exhibits no discharge. Left eye exhibits no discharge. No scleral icterus.  Neck: Normal range of motion. Neck supple. No JVD present. Carotid bruit is not present.  Cardiovascular: Normal rate, regular rhythm and normal heart sounds.  Pulmonary/Chest: Effort normal and breath sounds normal.    Abdominal: Normal appearance. There is no splenomegaly or hepatomegaly.  Musculoskeletal: Normal range of motion.  Neurological: She is alert and oriented to person, place, and time.  Skin: Skin is warm, dry and intact. No rash noted. No pallor.  Psychiatric: She has a normal mood and affect. Her behavior is normal. Judgment and thought content normal.    Results for orders placed or performed in visit on 10/13/17  Urine Culture  Result Value Ref Range   Urine Culture, Routine Final report    Organism ID, Bacteria Comment   POCT Urinalysis Dipstick  Result Value Ref Range   Color, UA yellow    Clarity, UA clear    Glucose, UA neg    Bilirubin, UA neg    Ketones, UA neg    Spec Grav, UA 1.020 1.010 - 1.025   Blood, UA neg    pH, UA 5.0 5.0 - 8.0   Protein, UA trace    Urobilinogen, UA 0.2 0.2 or 1.0 E.U./dL   Nitrite, UA negative    Leukocytes, UA Large (3+) (A) Negative   Appearance     Odor    PapIG, CtNgTv, HPV, rfx 16/18  Result Value Ref Range   DIAGNOSIS: Comment    Recommendation: Comment    Specimen adequacy: Comment    Clinician Provided ICD10  Comment    Performed by: Comment    Electronically signed by: Comment    PAP Smear Comment .    Note: Comment    Test Methodology Comment    HPV, high-risk Negative Negative   Chlamydia, Nuc. Acid Amp Negative Negative   Gonococcus, Nuc. Acid Amp Negative Negative   Trich vag by NAA Negative Negative  NuSwab BV and Candida, NAA  Result Value Ref Range   Atopobium vaginae Low - 0 Score   BVAB 2 Low - 0 Score   Megasphaera 1 Low - 0 Score   Candida albicans, NAA Negative Negative   Candida glabrata, NAA Positive (A) Negative      Assessment & Plan:   Problem List Items Addressed This Visit    None    Visit Diagnoses    Glossitis    -  Primary   Due to thrush.  Warm water gargles.     Vaginal yeast infection       Itching and burning.  Rx for Diflucan   Relevant Medications   fluconazole (DIFLUCAN) 150 MG  tablet   nystatin (MYCOSTATIN) 100000 UNIT/ML suspension   Thrush, oral       Start Nystatin swish and swallow.  Pt ed on yogurt    Relevant Medications   fluconazole (DIFLUCAN) 150 MG tablet   nystatin (MYCOSTATIN) 100000 UNIT/ML suspension       Follow up plan: Return if symptoms worsen or fail to improve.

## 2017-11-07 ENCOUNTER — Ambulatory Visit: Payer: Managed Care, Other (non HMO) | Admitting: Obstetrics and Gynecology

## 2017-11-10 ENCOUNTER — Ambulatory Visit (INDEPENDENT_AMBULATORY_CARE_PROVIDER_SITE_OTHER): Payer: Managed Care, Other (non HMO) | Admitting: Obstetrics and Gynecology

## 2017-11-10 ENCOUNTER — Encounter: Payer: Self-pay | Admitting: Obstetrics and Gynecology

## 2017-11-10 ENCOUNTER — Ambulatory Visit: Payer: Managed Care, Other (non HMO) | Admitting: Obstetrics and Gynecology

## 2017-11-10 VITALS — BP 102/66 | HR 64 | Ht 67.0 in | Wt 232.0 lb

## 2017-11-10 DIAGNOSIS — N760 Acute vaginitis: Secondary | ICD-10-CM | POA: Diagnosis not present

## 2017-11-10 NOTE — Progress Notes (Signed)
Patient ID: Audrey Wolfe, female   DOB: 1981-02-28, 37 y.o.   MRN: [id number]  Reason for Consult: Follow-up (PID/yeast)   Referred by Gabriel Cirri, NP  Subjective:     HPI:  Jimmie Dattilio is a 37 y.o. female patient was being treated for PID. She was having gastric upset from the medications and spaced out her treatment with the antibiotics and did not take them as directed. She then developed oral thrush which she was treated for by her primary care physician. She was prescribed terazol vaginal ointment for a yeast infection. She says that she took this medication, but that her symptoms have not completely resolved. She still has vaginal and vulvar discomfort. Patient feels like she has had a small improvement in her symptoms.   Past Medical History:  Diagnosis Date  . Abnormal glucose   . Anxiety   . Depression   . Obesity   . Seizures (HCC)    stress seizure  . Sleep apnea    Family History  Problem Relation Age of Onset  . Diabetes Father   . Asthma Daughter   . Stroke Maternal Grandmother   . Atrial fibrillation Maternal Grandmother   . Heart murmur Maternal Grandmother   . Cancer Maternal Grandfather        liver  . Alcohol abuse Paternal Grandfather    Past Surgical History:  Procedure Laterality Date  . GASTRIC ROUX-EN-Y N/A 10/03/2015   Procedure: LAPAROSCOPIC ROUX-EN-Y GASTRIC;  Surgeon: Geoffry Paradise, MD;  Location: [facility] ORS;  Service: General;  Laterality: N/A;    Short Social History:  Social History   Tobacco Use  . Smoking status: Never Smoker  . Smokeless tobacco: Never Used  Substance Use Topics  . Alcohol use: No    Allergies  Allergen Reactions  . Penicillins Rash    Childhood    Current Outpatient Medications  Medication Sig Dispense Refill  . acetaminophen (TYLENOL) 500 MG tablet Take 1,000 mg by mouth every 6 (six) hours as needed.    . fluconazole (DIFLUCAN) 150 MG tablet TAKE 1 TABLET BY MOUTH AS ONE DOSE  0  . FLUoxetine (PROZAC) 20  MG capsule TAKE ONE CAPSULE BY MOUTH EVERY DAY. 90 capsule 3  . medroxyPROGESTERone (DEPO-PROVERA) 150 MG/ML injection Inject 150 mg into the muscle every 3 (three) months.    . nystatin (MYCOSTATIN) 100000 UNIT/ML suspension Take 5 mLs (500,000 Units total) by mouth 4 (four) times daily. 60 mL 0  . UNABLE TO FIND 3 (three) times daily. Bariatric Advantage - Ultra Multi Formula with iron    . terconazole (TERAZOL 3) 0.8 % vaginal cream Place 1 applicator vaginally at bedtime. (Patient not taking: Reported on 11/10/2017) 20 g 1   Current Facility-Administered Medications  Medication Dose Route Frequency Provider Last Rate Last Dose  . medroxyPROGESTERone (DEPO-PROVERA) injection 150 mg  150 mg Intramuscular Q90 days Gabriel Cirri, NP   150 mg at 11/01/16 1124    Review of Systems  Constitutional: Negative for chills, fatigue, fever and unexpected weight change.  HENT: Negative for trouble swallowing.  Eyes: Negative for loss of vision.  Respiratory: Negative for cough, shortness of breath and wheezing.  Cardiovascular: Negative for chest pain, leg swelling, palpitations and syncope.  GI: Negative for abdominal pain, blood in stool, diarrhea, nausea and vomiting.  GU: Negative for difficulty urinating, dysuria, frequency and hematuria.  Musculoskeletal: Negative for back pain, leg pain and joint pain.  Skin: Negative for rash.  Neurological: Negative for dizziness,  headaches, light-headedness, numbness and seizures.  Psychiatric: Negative for behavioral problem, confusion, depressed mood and sleep disturbance.        Objective:  Objective   Vitals:   11/10/17 1414  BP: 102/66  Pulse: 64  Weight: 232 lb (105.2 kg)  Height: 5\' 7"  (1.702 m)   Body mass index is 36.34 kg/m.  Physical Exam  Constitutional: She is oriented to person, place, and time. She appears well-developed and well-nourished.  HENT:  Head: Normocephalic and atraumatic.  Cardiovascular: Normal rate and regular  rhythm.  Pulmonary/Chest: Effort normal.  Abdominal: Soft.  Neurological: She is alert and oriented to person, place, and time.  Skin: Skin is warm and dry.  Psychiatric: She has a normal mood and affect. Her behavior is normal. Judgment and thought content normal.        Assessment/Plan:    Vaginitis- One swab sent to evaluate for infection. Will treat patient based on results.    Natale Milchhristanna R Marielle Mantione MD  11/26/17 4:30 PM

## 2017-11-11 ENCOUNTER — Ambulatory Visit (INDEPENDENT_AMBULATORY_CARE_PROVIDER_SITE_OTHER): Payer: Managed Care, Other (non HMO)

## 2017-11-11 DIAGNOSIS — Z309 Encounter for contraceptive management, unspecified: Secondary | ICD-10-CM

## 2017-11-11 DIAGNOSIS — Z3009 Encounter for other general counseling and advice on contraception: Secondary | ICD-10-CM | POA: Diagnosis not present

## 2017-11-11 NOTE — Addendum Note (Signed)
Addended by: Pablo LedgerUSSELL, BRITTANY N on: 11/11/2017 05:12 PM   Modules accepted: Orders

## 2017-11-26 ENCOUNTER — Other Ambulatory Visit: Payer: Self-pay | Admitting: Obstetrics and Gynecology

## 2017-11-26 DIAGNOSIS — B379 Candidiasis, unspecified: Secondary | ICD-10-CM

## 2017-11-26 MED ORDER — BORIC ACID CRYS: 600.0000 mg | CRYSTALS | Freq: Every day | 0 refills | Status: DC

## 2017-11-28 ENCOUNTER — Other Ambulatory Visit: Payer: Self-pay

## 2017-11-28 ENCOUNTER — Telehealth: Payer: Self-pay

## 2017-11-28 DIAGNOSIS — B379 Candidiasis, unspecified: Secondary | ICD-10-CM

## 2017-11-28 MED ORDER — BORIC ACID CRYS
600.0000 mg | CRYSTALS | Freq: Every day | 0 refills | Status: AC
Start: 2017-11-28 — End: 2017-12-12

## 2017-11-28 NOTE — Telephone Encounter (Signed)
Could you please call the prescription into [facility] and let the patient know? Thank you!! -Farren Landa

## 2017-11-28 NOTE — Telephone Encounter (Signed)
Please advise. Thank you! [facility] in [place] also does compounding.

## 2017-11-28 NOTE — Telephone Encounter (Signed)
Left msg for pt that she can get #14 boric acid 600 mg vaginal suppositories at [facility] for $16.80. Requested pt to call me back to let me know if she would like us to send rx there.

## 2017-11-28 NOTE — Telephone Encounter (Signed)
Pt aware and requested rx be sent to [facility]. Refilled e-rx of boric and sent to updated pharmacy.

## 2017-11-28 NOTE — Telephone Encounter (Signed)
Pt saw [facility], boric acid rx'd to [facility], it is a compound, [facility].  Could another option be rx'd b/c the nearest compounding pharm is in [place]?  [phone]

## 2017-12-08 ENCOUNTER — Ambulatory Visit: Payer: Managed Care, Other (non HMO) | Admitting: Obstetrics and Gynecology

## 2017-12-16 ENCOUNTER — Emergency Department
Admission: EM | Admit: 2017-12-16 | Discharge: 2017-12-16 | Disposition: A | Discharge status: Home / Self Care | Payer: Managed Care, Other (non HMO) | Attending: Emergency Medicine | Admitting: Emergency Medicine

## 2017-12-16 ENCOUNTER — Emergency Department: Payer: Managed Care, Other (non HMO)

## 2017-12-16 ENCOUNTER — Encounter: Payer: Self-pay | Admitting: Emergency Medicine

## 2017-12-16 DIAGNOSIS — M545 Low back pain, unspecified: Secondary | ICD-10-CM

## 2017-12-16 DIAGNOSIS — Z79899 Other long term (current) drug therapy: Secondary | ICD-10-CM | POA: Diagnosis not present

## 2017-12-16 MED ORDER — TRAMADOL HCL 50 MG PO TABS: 50.0000 mg | ORAL_TABLET | Freq: Four times a day (QID) | ORAL | 0 refills | Status: DC | PRN

## 2017-12-16 MED ORDER — CYCLOBENZAPRINE HCL 10 MG PO TABS: 10.0000 mg | ORAL_TABLET | Freq: Three times a day (TID) | ORAL | 0 refills | Status: DC | PRN

## 2017-12-16 MED ORDER — HYDROMORPHONE HCL 1 MG/ML IJ SOLN
1.0000 mg | Freq: Once | INTRAMUSCULAR | Status: AC
  Administered 2017-12-16: 1 mg via INTRAMUSCULAR
  Filled 2017-12-16: qty 1

## 2017-12-16 MED ORDER — KETOROLAC TROMETHAMINE 60 MG/2ML IM SOLN
60.0000 mg | Freq: Once | INTRAMUSCULAR | Status: AC
  Administered 2017-12-16: 60 mg via INTRAMUSCULAR
  Filled 2017-12-16: qty 2

## 2017-12-16 MED ORDER — CYCLOBENZAPRINE HCL 10 MG PO TABS
10.0000 mg | ORAL_TABLET | Freq: Once | ORAL | Status: AC
  Administered 2017-12-16: 10 mg via ORAL
  Filled 2017-12-16: qty 1

## 2017-12-16 MED ORDER — MELOXICAM 15 MG PO TABS: 15.0000 mg | ORAL_TABLET | Freq: Every day | ORAL | 0 refills | Status: DC

## 2017-12-16 NOTE — ED Notes (Signed)
Returned from xray

## 2017-12-16 NOTE — ED Provider Notes (Signed)
[facility] Emergency Department Provider Note   ____________________________________________   First MD Initiated Contact with Patient 12/16/17 1652     (approximate)  I have reviewed the triage vital signs and the nursing notes.   HISTORY  Chief Complaint Back Pain    HPI Audrey RoersHeather Wolfe is a 37 y.o. female patient complain of low back pain which began yesterday.  Patient denies radicular component to her back pain.  Patient denies bladder bowel dysfunction.  Patient is a history of chronic back pain.  Patient followed by chiropractor.  Patient states that the chiropractor tried manipulation yesterday without success.  Patient states applying cold compresses to area with no relief.  Patient states pain increases with sitting.  Patient states pain increases with laying on her right side.  Past Medical History:  Diagnosis Date  . Abnormal glucose   . Anxiety   . Depression   . Obesity   . Seizures (HCC)    stress seizure  . Sleep apnea     Patient Active Problem List   Diagnosis Date Noted  . Bariatric surgery status 10/03/2015  . Obese 09/19/2015  . Anxiety and depression 09/12/2015  . Abnormal glucose level 09/12/2015  . Seizure (HCC) 09/12/2015  . Family planning 10/20/2012  . High-risk pregnancy 09/22/2012    Past Surgical History:  Procedure Laterality Date  . GASTRIC ROUX-EN-Y N/A 10/03/2015   Procedure: LAPAROSCOPIC ROUX-EN-Y GASTRIC;  Surgeon: Geoffry ParadiseJon M Bruce, MD;  Location: [facility] ORS;  Service: General;  Laterality: N/A;    Prior to Admission medications   Medication Sig Start Date End Date Taking? Authorizing Provider  acetaminophen (TYLENOL) 500 MG tablet Take 1,000 mg by mouth every 6 (six) hours as needed.    [provider]  cyclobenzaprine (FLEXERIL) 10 MG tablet Take 1 tablet (10 mg total) by mouth 3 (three) times daily as needed. 12/16/17   Joni ReiningSmith, Shervin Cypert K, PA-C  fluconazole (DIFLUCAN) 150 MG tablet TAKE 1 TABLET BY MOUTH  AS ONE DOSE 11/04/17   [provider]  FLUoxetine (PROZAC) 20 MG capsule TAKE ONE CAPSULE BY MOUTH EVERY DAY. 01/23/17   Gabriel CirriWicker, Cheryl, NP  medroxyPROGESTERone (DEPO-PROVERA) 150 MG/ML injection Inject 150 mg into the muscle every 3 (three) months.    [provider]  meloxicam (MOBIC) 15 MG tablet Take 1 tablet (15 mg total) by mouth daily. 12/16/17   Joni ReiningSmith, Kylian Loh K, PA-C  nystatin (MYCOSTATIN) 100000 UNIT/ML suspension Take 5 mLs (500,000 Units total) by mouth 4 (four) times daily. 11/04/17   Gabriel CirriWicker, Cheryl, NP  terconazole (TERAZOL 3) 0.8 % vaginal cream Place 1 applicator vaginally at bedtime. Patient not taking: Reported on 11/10/2017 10/18/17   Natale MilchSchuman, Christanna R, MD  traMADol (ULTRAM) 50 MG tablet Take 1 tablet (50 mg total) by mouth every 6 (six) hours as needed. 12/16/17 12/16/18  Joni ReiningSmith, Marijean Montanye K, PA-C  UNABLE TO FIND 3 (three) times daily. Bariatric Advantage - Ultra Multi Formula with iron    [provider]    Allergies Penicillins  Family History  Problem Relation Age of Onset  . Diabetes Father   . Asthma Daughter   . Stroke Maternal Grandmother   . Atrial fibrillation Maternal Grandmother   . Heart murmur Maternal Grandmother   . Cancer Maternal Grandfather        liver  . Alcohol abuse Paternal Grandfather     Social History Social History   Tobacco Use  . Smoking status: Never Smoker  . Smokeless tobacco: Never Used  Substance Use Topics  . Alcohol use: No  . Drug use: No    Review of Systems Constitutional: No fever/chills Eyes: No visual changes. ENT: No sore throat. Cardiovascular: Denies chest pain. Respiratory: Denies shortness of breath. Gastrointestinal: No abdominal pain.  No nausea, no vomiting.  No diarrhea.  No constipation. Genitourinary: Negative for dysuria. Musculoskeletal: Positive for back pain. Skin: Negative for rash. Neurological: Negative for headaches, focal weakness or numbness. Psychiatric:Anxiety and  depression Allergic/Immunilogical: Penicillin ____________________________________________   PHYSICAL EXAM:  VITAL SIGNS: ED Triage Vitals  Enc Vitals Group     BP 12/16/17 1633 115/62     Pulse Rate 12/16/17 1633 88     Resp 12/16/17 1633 18     Temp 12/16/17 1633 98.6 F (37 C)     Temp Source 12/16/17 1633 Oral     SpO2 12/16/17 1633 100 %     Weight 12/16/17 1633 228 lb (103.4 kg)     Height 12/16/17 1633 5\' 7"  (1.702 m)     Head Circumference --      Peak Flow --      Pain Score 12/16/17 1639 8     Pain Loc --      Pain Edu? --      Excl. in GC? --    Constitutional: Alert and oriented. Well appearing and in no acute distress. Cardiovascular: Normal rate, regular rhythm. Grossly normal heart sounds.  Good peripheral circulation. Respiratory: Normal respiratory effort.  No retractions. Lungs CTAB. Musculoskeletal: No lower extremity tenderness nor edema.  No joint effusions. Neurologic:  Normal speech and language. No gross focal neurologic deficits are appreciated. No gait instability. Skin:  Skin is warm, dry and intact. No rash noted. Psychiatric: Mood and affect are normal. Speech and behavior are normal.  ____________________________________________   LABS (all labs ordered are listed, but only abnormal results are displayed)  Labs Reviewed - No data to display ____________________________________________  EKG   ____________________________________________  RADIOLOGY  ED MD interpretation: Degenerative changes are noted at L5 and S1. Official radiology report(s): Dg Lumbar Spine Complete  Result Date: 12/16/2017 CLINICAL DATA:  Severe lower back pain.  No known injury. EXAM: LUMBAR SPINE - COMPLETE 4+ VIEW COMPARISON:  CT abdomen and pelvis dated November 30, 2015. FINDINGS: Five lumbar type vertebral bodies. No acute fracture or subluxation. Vertebral body heights are preserved. Straightening of the normal lumbar lordosis. Alignment is normal. Mild disc  height loss at L5-S1, unchanged. Remaining intervertebral disc spaces are maintained. Mild lower lumbar facet arthropathy. IMPRESSION: 1. Mild degenerative disc disease at L5-S1, similar to prior study. No acute osseous abnormality. Electronically Signed   By: Obie Dredge M.D.   On: 12/16/2017 18:03    ____________________________________________   PROCEDURES  Procedure(s) performed: None  Procedures  Critical Care performed: No  ____________________________________________   INITIAL IMPRESSION / ASSESSMENT AND PLAN / ED COURSE  As part of my medical decision making, I reviewed the following data within the electronic MEDICAL RECORD NUMBER    Back pain secondary to degenerative changes.  Discussed x-ray findings with patient.  Advised patient to follow-up family doctor for continued care.  Patient given a work note advised take medication as directed.      ____________________________________________   FINAL CLINICAL IMPRESSION(S) / ED DIAGNOSES  Final diagnoses:  Acute low back pain without sciatica, unspecified back pain laterality     ED Discharge Orders        Ordered    cyclobenzaprine (FLEXERIL) 10 MG tablet  3 times daily PRN     12/16/17 1804    traMADol (ULTRAM) 50 MG tablet  Every 6 hours PRN     12/16/17 1804    meloxicam (MOBIC) 15 MG tablet  Daily     12/16/17 1804       Note:  This document was prepared using Dragon voice recognition software and may include unintentional dictation errors.    Joni Reining, PA-C 12/16/17 1808    Pershing Proud Myra Rude, MD 12/16/17 [phone]

## 2017-12-16 NOTE — ED Notes (Signed)
See triage note  States she has a"weak" back  Rested this past weekend  Went to see chiropractor yesterday    States pain is move severe today  Describes  Pain as muscle spasms  Tried tylenol and ice w/o relief  Unable to stan up straight d/t pain

## 2017-12-16 NOTE — ED Triage Notes (Signed)
Pt with low back pain since yesterday.

## 2018-01-15 ENCOUNTER — Other Ambulatory Visit: Payer: Self-pay | Admitting: Unknown Physician Specialty

## 2018-01-15 NOTE — Telephone Encounter (Signed)
Patient called, left detailed VM that she will need to be seen in the office to evaluate her lower back pain due to the medication was not filled by Gabriel Cirriheryl Wicker, asked her to call to schedule an appointment.

## 2018-01-15 NOTE — Telephone Encounter (Signed)
Patient called to discuss symptoms of back pain and to let her know she would need an appointment in order to get a refill, unable to leave VM due to mailbox full.

## 2018-01-15 NOTE — Telephone Encounter (Signed)
Copied from CRM [id number]. Topic: Quick Communication - Rx Refill/Question >> Jan 15, 2018  2:55 PM Alexander BergeronBarksdale, Harvey B wrote: Medication: meloxicam (MOBIC) 15 MG tablet [[id number]][[id number]]  Has the patient contacted their pharmacy? Yes.   (Agent: If no, request that the patient contact the pharmacy for the refill.) [facility]erred [facility] (with phone number [facility] Agent: Please be advised that RX refills may take up to 3 business days. We ask that you follow-up with your pharmacy.

## 2018-01-26 ENCOUNTER — Other Ambulatory Visit: Payer: Self-pay | Admitting: Unknown Physician Specialty

## 2018-01-26 NOTE — Telephone Encounter (Signed)
Your patient 

## 2018-03-02 IMAGING — CR DG LUMBAR SPINE COMPLETE 4+V
1 series · 5 of 5 positions shown · non-contrast
Comparison: CT abdomen and pelvis dated November 30, 2015.

CLINICAL DATA: Severe lower back pain.  No known injury.

EXAM:
LUMBAR SPINE - COMPLETE 4+ VIEW

[Series 1: dg lumbar spine complete 4 +v · 0.14mm/px · 5 of 5 slices shown]
[im 1/5]
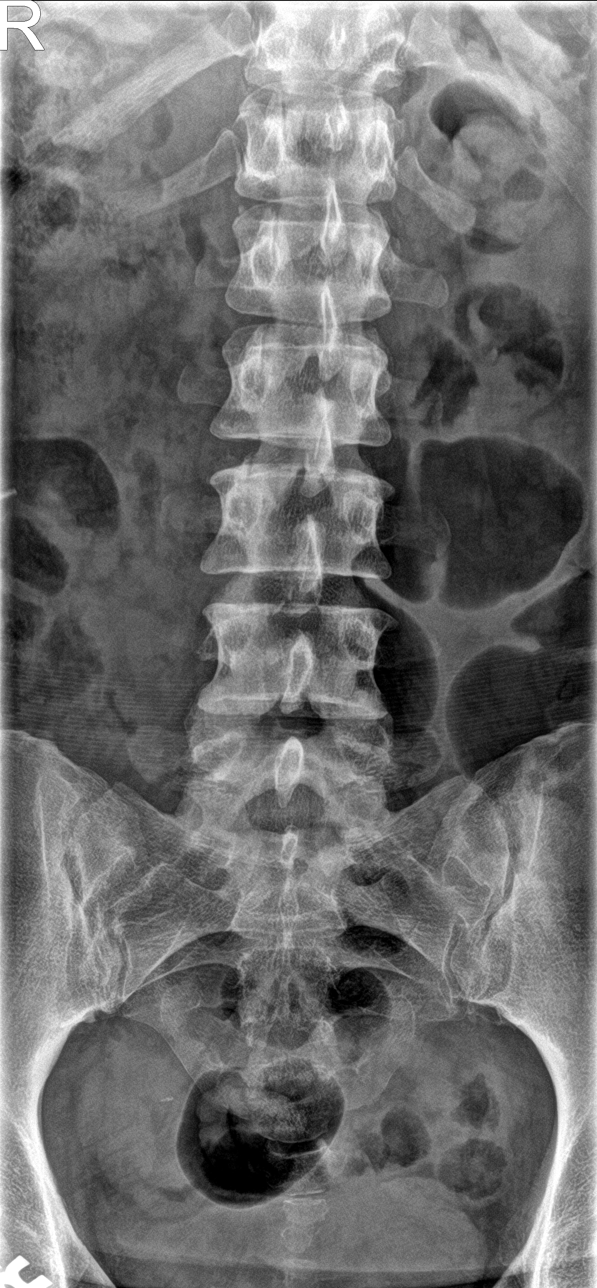
[im 2/5]
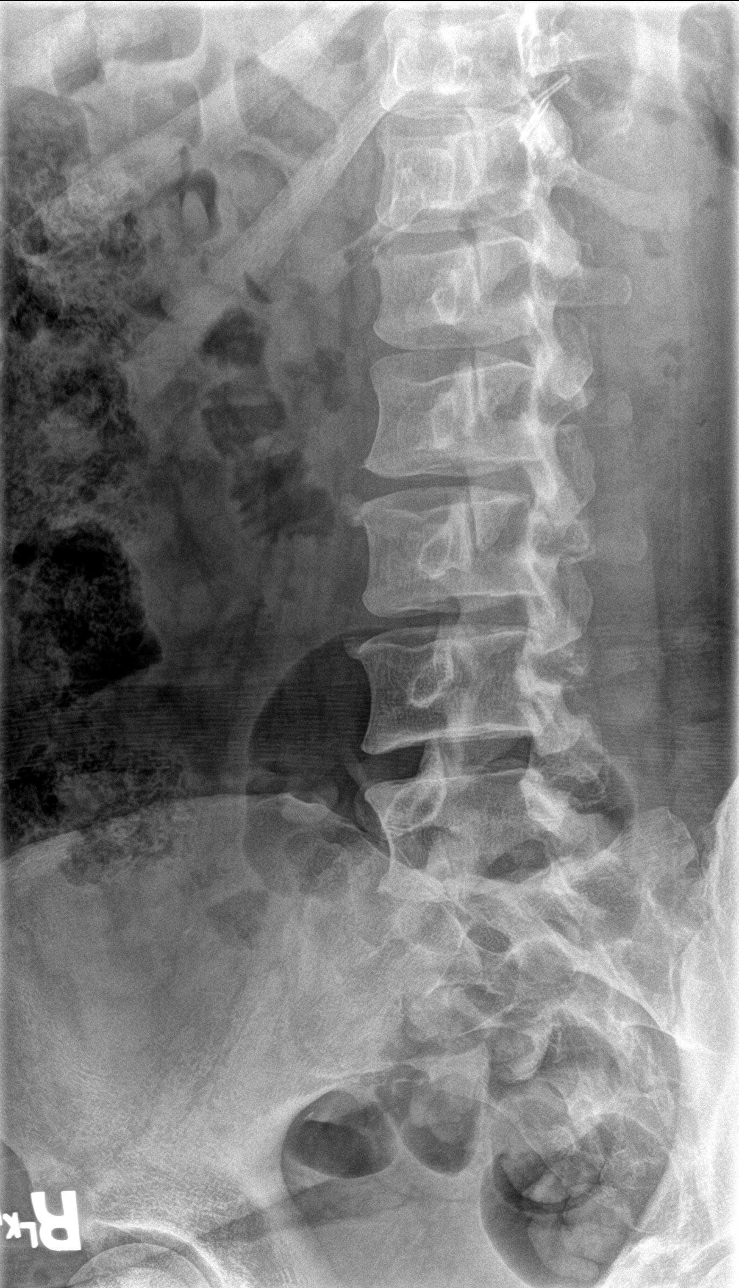
[im 3/5]
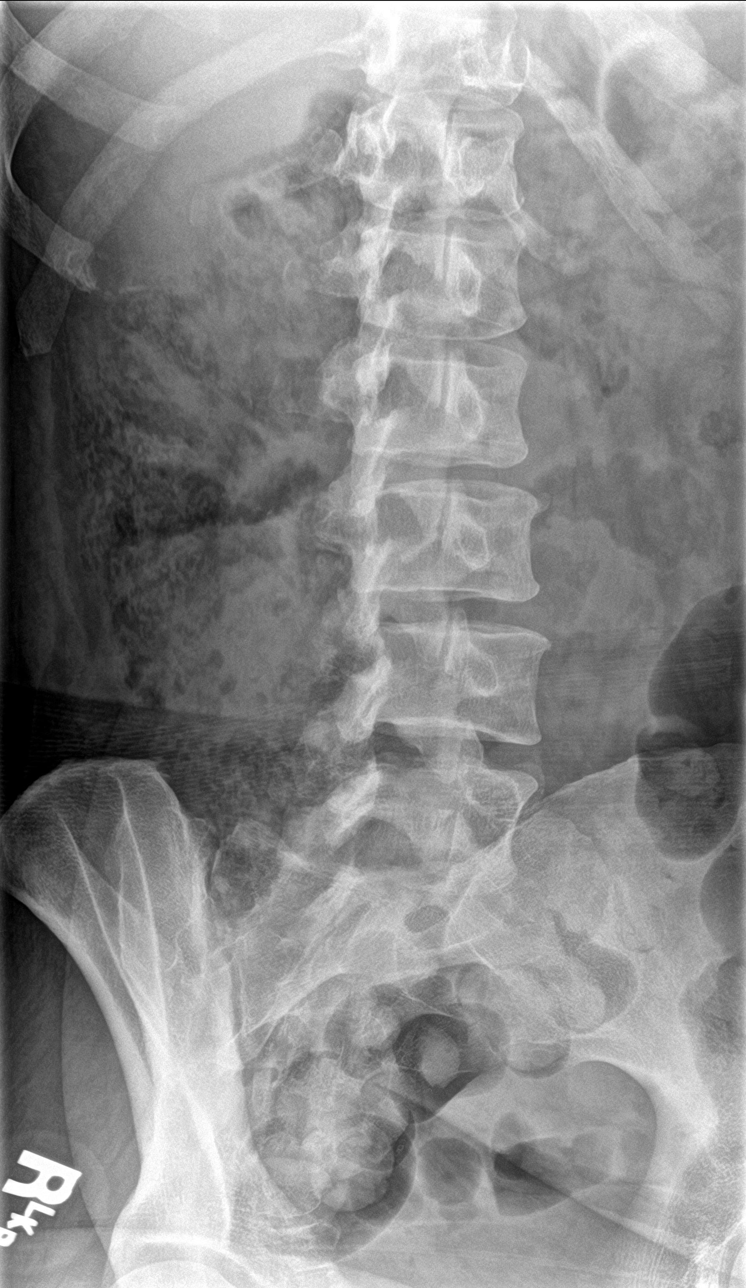
[im 4/5]
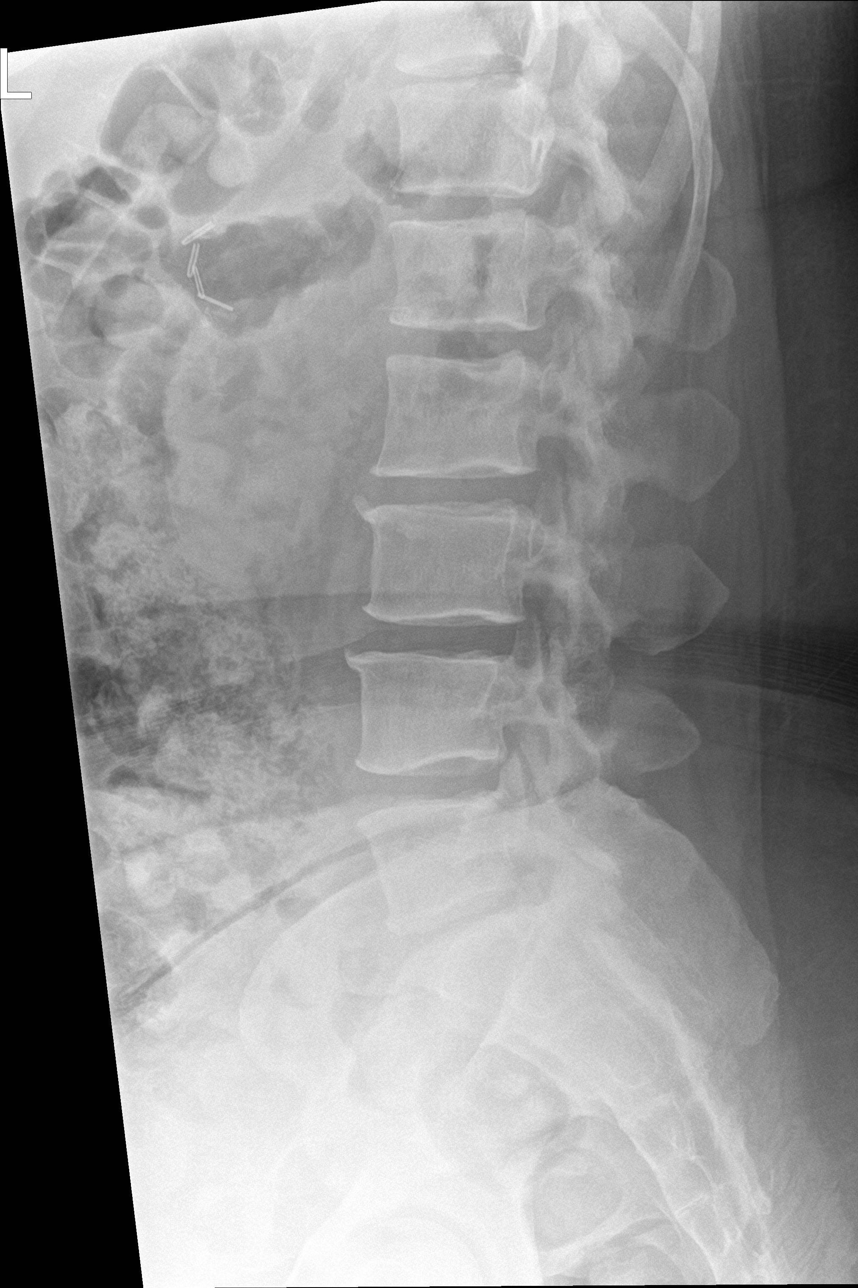
[im 5/5]
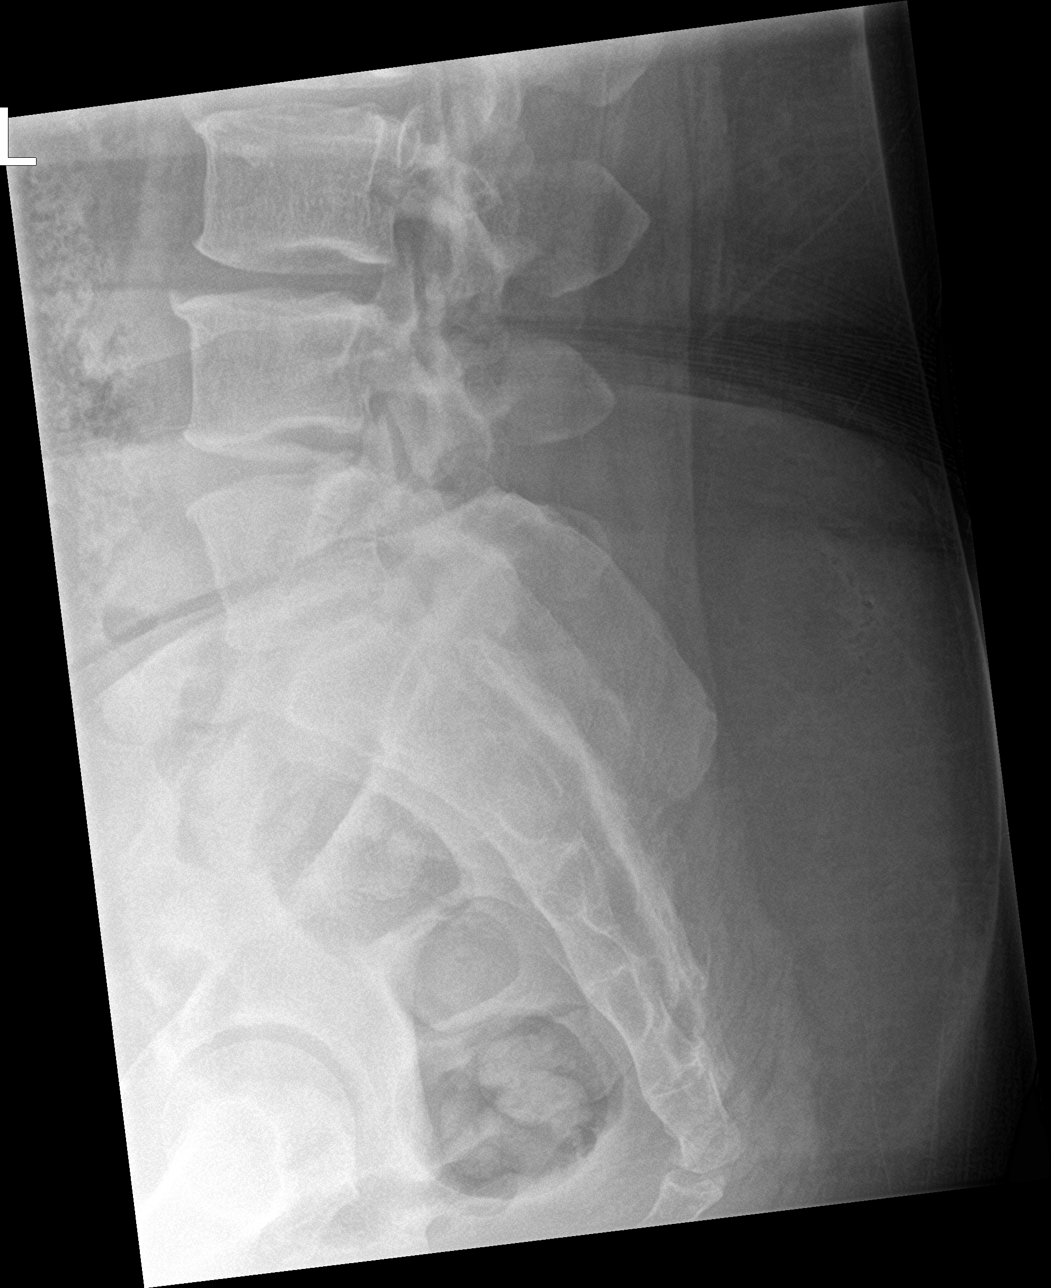

[5 of 5 positions shown; findings below may reference images not displayed]

FINDINGS: Five lumbar type vertebral bodies. No acute fracture or subluxation.
Vertebral body heights are preserved. Straightening of the normal
lumbar lordosis. Alignment is normal. Mild disc height loss at
L5-S1, unchanged. Remaining intervertebral disc spaces are
maintained. Mild lower lumbar facet arthropathy.
IMPRESSION: 1. Mild degenerative disc disease at L5-S1, similar to prior study.
No acute osseous abnormality.

## 2018-03-08 ENCOUNTER — Other Ambulatory Visit: Payer: Self-pay | Admitting: Unknown Physician Specialty

## 2018-08-17 ENCOUNTER — Encounter: Payer: Self-pay | Admitting: Family Medicine

## 2018-08-17 ENCOUNTER — Ambulatory Visit: Payer: Managed Care, Other (non HMO) | Admitting: Family Medicine

## 2018-08-17 VITALS — BP 115/79 | HR 95 | Temp 99.1°F | Wt 247.3 lb

## 2018-08-17 DIAGNOSIS — M545 Low back pain, unspecified: Secondary | ICD-10-CM

## 2018-08-17 DIAGNOSIS — Z3042 Encounter for surveillance of injectable contraceptive: Secondary | ICD-10-CM | POA: Diagnosis not present

## 2018-08-17 LAB — PREGNANCY, URINE: Preg Test, Ur: NEGATIVE

## 2018-08-17 MED ORDER — TRAMADOL HCL 50 MG PO TABS: 50.0000 mg | ORAL_TABLET | Freq: Two times a day (BID) | ORAL | 0 refills | Status: DC | PRN

## 2018-08-17 MED ORDER — TRAMADOL HCL 50 MG PO TABS: 50.0000 mg | ORAL_TABLET | Freq: Two times a day (BID) | ORAL | 0 refills | Status: AC | PRN

## 2018-08-17 MED ORDER — MELOXICAM 7.5 MG PO TABS: 7.5000 mg | ORAL_TABLET | Freq: Every day | ORAL | 0 refills | Status: DC

## 2018-08-17 MED ORDER — CYCLOBENZAPRINE HCL 10 MG PO TABS: 10.0000 mg | ORAL_TABLET | Freq: Every evening | ORAL | 0 refills | Status: DC | PRN

## 2018-08-17 MED ORDER — MEDROXYPROGESTERONE ACETATE 150 MG/ML IM SUSP
150.0000 mg | INTRAMUSCULAR | Status: AC
  Administered 2018-08-17: 150 mg via INTRAMUSCULAR

## 2018-08-17 MED ORDER — TRIAMCINOLONE ACETONIDE 40 MG/ML IJ SUSP
40.0000 mg | Freq: Once | INTRAMUSCULAR | Status: AC
  Administered 2018-08-17: 40 mg via INTRAMUSCULAR

## 2018-08-17 NOTE — Progress Notes (Signed)
BP 115/79 (BP Location: Left Arm, Patient Position: Sitting, Cuff Size: Normal)   Pulse 95   Temp 99.1 F (37.3 C)   Wt 247 lb 5 oz (112.2 kg)   SpO2 100%   BMI 38.73 kg/m    Subjective:    Patient ID: Audrey Wolfe, female    DOB: 02-27-1981, 37 y.o.   MRN: [id number]  HPI: Audrey Wolfe is a 37 y.o. female  Chief Complaint  Patient presents with  . Back Pain    X 1 day   Here today for 1 day of low back pain that's worst on the right. Hx of arthritis and bone spurs in her back per patient. Has gone to the ER earlier this year for a flare of this issue and has had multiple of these flares the past year. Doesn't necessarily come with any change in activity, sometimes weather related but other times seems out of the blue. Movement makes it much worse. Taking ibuprofen, naproxen with no relief. Used to take daily meloxicam 15 mg which seemed to keep things at bay. Of note, pt has hx of gastric bypass surgery. Denies fevers, chills, radiation of pain, numbness or weakness of legs, incontinence, dysuria.   Came off depo several months ago due to low libido and vaginal dryness. Wanting to go back on as she tolerated it well otherwise and her husband is not ready for a vasectomy at this time.   Relevant past medical, surgical, family and social history reviewed and updated as indicated. Interim medical history since our last visit reviewed. Allergies and medications reviewed and updated.  Review of Systems  Per HPI unless specifically indicated above     Objective:    BP 115/79 (BP Location: Left Arm, Patient Position: Sitting, Cuff Size: Normal)   Pulse 95   Temp 99.1 F (37.3 C)   Wt 247 lb 5 oz (112.2 kg)   SpO2 100%   BMI 38.73 kg/m   Wt Readings from Last 3 Encounters:  08/17/18 247 lb 5 oz (112.2 kg)  12/16/17 228 lb (103.4 kg)  11/10/17 232 lb (105.2 kg)    Physical Exam  Constitutional: She is oriented to person, place, and time. She appears well-developed and  well-nourished. No distress.  HENT:  Head: Atraumatic.  Eyes: Conjunctivae and EOM are normal.  Neck: Normal range of motion. Neck supple.  Cardiovascular: Normal rate and regular rhythm.  Pulmonary/Chest: Effort normal and breath sounds normal.  Abdominal: Soft. Bowel sounds are normal.  Musculoskeletal: She exhibits no edema or deformity.  Antalgic gait and movements No midline ttp over spine Mild paraspinal ttp b/l in lumbar region  Neurological: She is alert and oriented to person, place, and time.  Skin: Skin is warm and dry.  Psychiatric: She has a normal mood and affect. Her behavior is normal.  Nursing note and vitals reviewed.   Results for orders placed or performed in visit on 10/13/17  Urine Culture  Result Value Ref Range   Urine Culture, Routine Final report    Organism ID, Bacteria Comment   POCT Urinalysis Dipstick  Result Value Ref Range   Color, UA yellow    Clarity, UA clear    Glucose, UA neg    Bilirubin, UA neg    Ketones, UA neg    Spec Grav, UA 1.020 1.010 - 1.025   Blood, UA neg    pH, UA 5.0 5.0 - 8.0   Protein, UA trace    Urobilinogen, UA 0.2 0.2  or 1.0 E.U./dL   Nitrite, UA negative    Leukocytes, UA Large (3+) (A) Negative   Appearance     Odor    PapIG, CtNgTv, HPV, rfx 16/18  Result Value Ref Range   DIAGNOSIS: Comment    Recommendation: Comment    Specimen adequacy: Comment    Clinician Provided ICD10 Comment    Performed by: Comment    Electronically signed by: Comment    PAP Smear Comment .    Note: Comment    Test Methodology Comment    HPV, high-risk Negative Negative   Chlamydia, Nuc. Acid Amp Negative Negative   Gonococcus, Nuc. Acid Amp Negative Negative   Trich vag by NAA Negative Negative  NuSwab BV and Candida, NAA  Result Value Ref Range   Atopobium vaginae Low - 0 Score   BVAB 2 Low - 0 Score   Megasphaera 1 Low - 0 Score   Candida albicans, NAA Negative Negative   Candida glabrata, NAA Positive (A) Negative       Assessment & Plan:   Problem List Items Addressed This Visit      Other   Encounter for surveillance of injectable contraceptive    Urine preg neg, will restart depo and continue to monitor.       Relevant Medications   medroxyPROGESTERone (DEPO-PROVERA) injection 150 mg   Other Relevant Orders   Pregnancy, urine    Other Visit Diagnoses    Acute right-sided low back pain without sciatica    -  Primary   IM kenalog given, ortho referral placed. meloxicam at 7.5 mg daily prn. avoid OTC NSAIDs. Tramadol, flexeril for severe pain QHS prn. Sedation precautions given   Relevant Medications   cyclobenzaprine (FLEXERIL) 10 MG tablet   meloxicam (MOBIC) 7.5 MG tablet   traMADol (ULTRAM) 50 MG tablet   triamcinolone acetonide (KENALOG-40) injection 40 mg (Completed)   Other Relevant Orders   AMB referral to orthopedics       Follow up plan: Return if symptoms worsen or fail to improve.

## 2018-08-17 NOTE — Patient Instructions (Signed)
Follow up as needed

## 2018-08-17 NOTE — Assessment & Plan Note (Signed)
Urine preg neg, will restart depo and continue to monitor.

## 2018-09-08 ENCOUNTER — Other Ambulatory Visit: Payer: Self-pay | Admitting: Family Medicine

## 2018-09-12 ENCOUNTER — Other Ambulatory Visit: Payer: Self-pay | Admitting: Family Medicine

## 2018-09-14 NOTE — Telephone Encounter (Signed)
Requested medication (s) are due for refill today: Yes  Requested medication (s) are on the active medication list: Yes  Last refill:  08/17/18  Future visit scheduled: No  Notes to clinic:  Unable to refill per protocol.     Requested Prescriptions  Pending Prescriptions Disp Refills   cyclobenzaprine (FLEXERIL) 10 MG tablet [Pharmacy Med Name: CYCLOBENZAPRINE 10 MG TABLET] 30 tablet 0    Sig: Take 1 tablet (10 mg total) by mouth at bedtime as needed.     Not Delegated - Analgesics:  Muscle Relaxants Failed - 09/12/2018 11:27 AM      Failed - This refill cannot be delegated      Passed - Valid encounter within last 6 months    Recent Outpatient Visits          4 weeks ago Acute right-sided low back pain without sciatica   [facility] [place]   10 months ago Glossitis   [facility] Gabriel CirriWicker, Cheryl, NP   1 year ago Anxiety and depression   [facility] [place], [place]   1 year ago Amenorrhea   [facility] Gabriel CirriWicker, Cheryl, NP   2 years ago Routine general medical examination at a health care facility   [facility] Cir[facility] Cheryl, NP

## 2018-11-04 ENCOUNTER — Other Ambulatory Visit: Payer: Self-pay | Admitting: Family Medicine

## 2018-11-04 NOTE — Telephone Encounter (Signed)
Requested medication (s) are due for refill today: yes  Requested medication (s) are on the active medication list: yes  Last refill:  09/08/18  Future visit scheduled: no  Notes to clinic:  Routing to provider to review whether or not to approve- pt with h/o gastric bypass   Requested Prescriptions  Pending Prescriptions Disp Refills   meloxicam (MOBIC) 7.5 MG tablet [Pharmacy Med Name: MELOXICAM 7.5 MG TABLET] 30 tablet 0    Sig: TAKE 1 TABLET BY MOUTH EVERY DAY     Analgesics:  COX2 Inhibitors Failed - 11/04/2018  2:17 AM      Failed - HGB in normal range and within 360 days    Hemoglobin  Date Value Ref Range Status  08/13/2017 12.4 11.1 - 15.9 g/dL Final         Failed - Cr in normal range and within 360 days    Creatinine  Date Value Ref Range Status  07/20/2014 1.13 0.60 - 1.30 mg/dL Final   Creatinine, Ser  Date Value Ref Range Status  08/13/2017 0.72 0.57 - 1.00 mg/dL Final         Passed - Patient is not pregnant      Passed - Valid encounter within last 12 months    Recent Outpatient Visits          2 months ago Acute right-sided low back pain without sciatica   [facility] Particia Nearing, PA-C   1 year ago Glossitis   [facility] Gabriel Cirri, NP   1 year ago Anxiety and depression   [facility] Particia Nearing, [place]   2 years ago Amenorrhea   [facility] Gabriel Cirri, NP   2 years ago Routine general medical examination at a health care facility   [facility] Gabriel Cirri, NP

## 2019-01-02 ENCOUNTER — Other Ambulatory Visit: Payer: Self-pay | Admitting: Family Medicine

## 2019-01-04 NOTE — Telephone Encounter (Signed)
Requested medication (s) are due for refill today: yes  Requested medication (s) are on the active medication list: yes  Last refill:  11/04/18 #30  Future visit scheduled: no  Notes to clinic: Provider please review.   Requested Prescriptions  Pending Prescriptions Disp Refills   meloxicam (MOBIC) 7.5 MG tablet [Pharmacy Med Name: MELOXICAM 7.5 MG TABLET] 30 tablet 0    Sig: TAKE 1 TABLET BY MOUTH EVERY DAY     Analgesics:  COX2 Inhibitors Failed - 01/02/2019 10:28 AM      Failed - HGB in normal range and within 360 days    Hemoglobin  Date Value Ref Range Status  08/13/2017 12.4 11.1 - 15.9 g/dL Final         Failed - Cr in normal range and within 360 days    Creatinine  Date Value Ref Range Status  07/20/2014 1.13 0.60 - 1.30 mg/dL Final   Creatinine, Ser  Date Value Ref Range Status  08/13/2017 0.72 0.57 - 1.00 mg/dL Final         Passed - Patient is not pregnant      Passed - Valid encounter within last 12 months    Recent Outpatient Visits          4 months ago Acute right-sided low back pain without sciatica   [facility] Particia Nearing, PA-C   1 year ago Glossitis   [facility] Gabriel Cirri, NP   1 year ago Anxiety and depression   [facility] Particia Nearing, [place]   2 years ago Amenorrhea   [facility] Gabriel Cirri, NP   2 years ago Routine general medical examination at a health care facility   [facility] Gabriel Cirri, NP

## 2019-01-19 ENCOUNTER — Other Ambulatory Visit: Payer: Self-pay | Admitting: Unknown Physician Specialty

## 2019-01-27 ENCOUNTER — Other Ambulatory Visit: Payer: Self-pay

## 2019-01-27 MED ORDER — MELOXICAM 15 MG PO TABS: 15.0000 mg | ORAL_TABLET | Freq: Every day | ORAL | 0 refills | Status: DC

## 2019-01-27 NOTE — Telephone Encounter (Signed)
[facility] Meloxicam 15 mg tab.

## 2019-01-27 NOTE — Telephone Encounter (Signed)
Meloxicam refill sent, for further refills will need appointment.

## 2019-01-27 NOTE — Telephone Encounter (Signed)
Tried to call patient. No answer. Left detailed VM message (DPR reviewed) and asked patient to return call with any questions about the message.

## 2019-01-30 ENCOUNTER — Other Ambulatory Visit: Payer: Self-pay | Admitting: Family Medicine

## 2019-02-18 ENCOUNTER — Other Ambulatory Visit: Payer: Self-pay | Admitting: Nurse Practitioner

## 2019-04-03 ENCOUNTER — Other Ambulatory Visit: Payer: Self-pay | Admitting: Unknown Physician Specialty

## 2019-04-03 NOTE — Telephone Encounter (Signed)
>  3 months over due for OV routing to office to set up virtual appt. Pt given 30 day courtesy refill.

## 2019-06-29 ENCOUNTER — Other Ambulatory Visit: Payer: Self-pay | Admitting: Nurse Practitioner

## 2019-06-29 NOTE — Telephone Encounter (Signed)
Called patient to schedule a f/up visit, no answer and no VM set up. Will send patient a letter.

## 2019-06-29 NOTE — Telephone Encounter (Signed)
Patient last seen 08/17/18.

## 2019-06-29 NOTE — Telephone Encounter (Signed)
Requested medication (s) are due for refill today: yes  Requested medication (s) are on the active medication list: yes  Last refill:  04/05/19 Future visit scheduled: no  Notes to clinic:  Review for refill  Requested Prescriptions  Pending Prescriptions Disp Refills   FLUoxetine (PROZAC) 20 MG capsule [Pharmacy Med Name: FLUOXETINE HCL 20 MG CAPSULE] 90 capsule 0    Sig: Take 1 capsule (20 mg total) by mouth daily. For further refills will need to see provider.     Psychiatry:  Antidepressants - SSRI Failed - 06/29/2019  1:26 AM      Failed - Valid encounter within last 6 months    Recent Outpatient Visits          10 months ago Acute right-sided low back pain without sciatica   [facility] Volney American, [place]   1 year ago [place] Kathrine Haddock, NP   1 year ago Anxiety and depression   [facility] Volney American, [place]   2 years ago Amenorrhea   [place], NP   3 years ago Routine general medical examination at a health care facility   [place], NP             Passed - Completed PHQ-2 or PHQ-9 in the last 360 days.

## 2019-06-29 NOTE — Telephone Encounter (Signed)
Thirty day supply to be provided only.  For further she will need to schedule appointment.

## 2019-09-20 ENCOUNTER — Other Ambulatory Visit: Payer: Self-pay | Admitting: Nurse Practitioner

## 2019-09-20 NOTE — Telephone Encounter (Signed)
Requested medication (s) are due for refill today: yes  Requested medication (s) are on the active medication list: yes  Last refill:  08/25/2019  Future visit scheduled: no  Notes to clinic:  Overdue for office visit  Review for refill   Requested Prescriptions  Pending Prescriptions Disp Refills   FLUoxetine (PROZAC) 20 MG capsule [Pharmacy Med Name: FLUOXETINE HCL 20 MG CAPSULE] 90 capsule 1    Sig: TAKE 1 CAPSULE (20 MG TOTAL) BY MOUTH DAILY. FOR FURTHER REFILLS WILL NEED TO SEE PROVIDER.     Psychiatry:  Antidepressants - SSRI Failed - 09/20/2019 11:31 AM      Failed - Valid encounter within last 6 months    Recent Outpatient Visits          1 year ago Acute right-sided low back pain without sciatica   [facility] Volney American, [place]   1 year ago [place] Kathrine Haddock, NP   2 years ago Anxiety and depression   [facility] Volney American, [place]   2 years ago Amenorrhea   [place], NP   3 years ago Routine general medical examination at a health care facility   [place], NP             Passed - Completed PHQ-2 or PHQ-9 in the last 360 days.

## 2019-09-20 NOTE — Telephone Encounter (Signed)
Called pt to schedule appt, no answer, left vm 

## 2019-09-24 ENCOUNTER — Other Ambulatory Visit: Payer: Self-pay | Admitting: Nurse Practitioner

## 2019-10-13 ENCOUNTER — Other Ambulatory Visit: Payer: Self-pay

## 2019-10-13 ENCOUNTER — Encounter: Payer: Self-pay | Admitting: Family Medicine

## 2019-10-13 ENCOUNTER — Ambulatory Visit (INDEPENDENT_AMBULATORY_CARE_PROVIDER_SITE_OTHER): Payer: Managed Care, Other (non HMO) | Admitting: Family Medicine

## 2019-10-13 VITALS — Wt 250.0 lb

## 2019-10-13 DIAGNOSIS — F419 Anxiety disorder, unspecified: Secondary | ICD-10-CM | POA: Diagnosis not present

## 2019-10-13 DIAGNOSIS — F32A Depression, unspecified: Secondary | ICD-10-CM

## 2019-10-13 DIAGNOSIS — F329 Major depressive disorder, single episode, unspecified: Secondary | ICD-10-CM

## 2019-10-13 DIAGNOSIS — Z3042 Encounter for surveillance of injectable contraceptive: Secondary | ICD-10-CM

## 2019-10-13 MED ORDER — FLUOXETINE HCL 20 MG PO CAPS: 20.0000 mg | ORAL_CAPSULE | Freq: Every day | ORAL | 3 refills | Status: DC

## 2019-10-13 MED ORDER — MEDROXYPROGESTERONE ACETATE 150 MG/ML IM SUSP
150.0000 mg | INTRAMUSCULAR | Status: AC
  Administered 2019-12-27: 150 mg via INTRAMUSCULAR

## 2019-10-13 NOTE — Progress Notes (Signed)
Wt 250 lb (113.4 kg)   BMI 39.16 kg/m    Subjective:    Patient ID: Audrey Wolfe, female    DOB: 1981-04-28, 38 y.o.   MRN: [id number]  HPI: Audrey Wolfe is a 38 y.o. female  Chief Complaint  Patient presents with  . Depression    fluoxetine refill    . This visit was completed via WebEx due to the restrictions of the COVID-19 pandemic. All issues as above were discussed and addressed. Physical exam was done as above through visual confirmation on WebEx. If it was felt that the patient should be evaluated in the office, they were directed there. The patient verbally consented to this visit. . Location of the patient: work . Location of the provider: work . Those involved with this call:  . Provider: Roosvelt Maser, PA-C . CMA: Elton Sin, CMA . Front Desk/Registration: Harriet Pho  . Time spent on call: 15 minutes with patient face to face via video conference. More than 50% of this time was spent in counseling and coordination of care. 5 minutes total spent in review of patient's record and preparation of their chart. I verified patient identity using two factors (patient name and date of birth). Patient consents verbally to being seen via telemedicine visit today.   Patient presenting today for f/u depression. Has been taking prozac for years and feels she continues to do well on current regimen. Moods stable, no SI/HI, anhedonia, sleep or appetite concerns. Tolerating well without side effects.   Took a break on the depo injection for several months. Wanting to get back on. Has had regular periods every month with last period being 1.5 weeks ago. Denies any concerns of pregnancy.   Depression screen [facility] 2/9 10/13/2019 08/13/2017 05/29/2016  Decreased Interest 0 0 0  Down, Depressed, Hopeless 0 0 0  PHQ - 2 Score 0 0 0  Altered sleeping 0 - -  Tired, decreased energy 1 - -  Change in appetite 0 - -  Feeling bad or failure about yourself  0 - -  Trouble concentrating 0 - -    Moving slowly or fidgety/restless 0 - -  Suicidal thoughts 0 - -  PHQ-9 Score 1 - -  Difficult doing work/chores Not difficult at all - -    Relevant past medical, surgical, family and social history reviewed and updated as indicated. Interim medical history since our last visit reviewed. Allergies and medications reviewed and updated.  Review of Systems  Per HPI unless specifically indicated above     Objective:    Wt 250 lb (113.4 kg)   BMI 39.16 kg/m   Wt Readings from Last 3 Encounters:  10/13/19 250 lb (113.4 kg)  08/17/18 247 lb 5 oz (112.2 kg)  12/16/17 228 lb (103.4 kg)    Physical Exam Vitals and nursing note reviewed.  Constitutional:      General: She is not in acute distress.    Appearance: Normal appearance.  HENT:     Head: Atraumatic.     Right Ear: External ear normal.     Left Ear: External ear normal.     Nose: Nose normal. No congestion.     Mouth/Throat:     Mouth: Mucous membranes are moist.     Pharynx: Oropharynx is clear. No posterior oropharyngeal erythema.  Eyes:     Extraocular Movements: Extraocular movements intact.     Conjunctiva/sclera: Conjunctivae normal.  Cardiovascular:     Comments: Unable to assess via virtual  visit Pulmonary:     Effort: Pulmonary effort is normal. No respiratory distress.  Musculoskeletal:        General: Normal range of motion.     Cervical back: Normal range of motion.  Skin:    General: Skin is dry.     Findings: No erythema.  Neurological:     Mental Status: She is alert and oriented to person, place, and time.  Psychiatric:        Mood and Affect: Mood normal.        Thought Content: Thought content normal.        Judgment: Judgment normal.     Results for orders placed or performed in visit on 08/17/18  Pregnancy, urine  Result Value Ref Range   Preg Test, Ur Negative Negative      Assessment & Plan:   Problem List Items Addressed This Visit      Other   Anxiety and depression -  Primary    Stable and under good control, continue current regimen      Relevant Medications   FLUoxetine (PROZAC) 20 MG capsule   Encounter for surveillance of injectable contraceptive    Declines urine pregnancy. Order placed for her to resume depo injections for contraception.       Relevant Medications   medroxyPROGESTERone (DEPO-PROVERA) injection 150 mg       Follow up plan: Return in about 1 year (around 10/12/2020) for CPE.

## 2019-10-17 NOTE — Assessment & Plan Note (Signed)
Declines urine pregnancy. Order placed for her to resume depo injections for contraception.

## 2019-10-17 NOTE — Assessment & Plan Note (Signed)
Stable and under good control, continue current regimen 

## 2019-10-27 ENCOUNTER — Other Ambulatory Visit: Payer: Self-pay | Admitting: Family Medicine

## 2019-10-27 DIAGNOSIS — Z3042 Encounter for surveillance of injectable contraceptive: Secondary | ICD-10-CM

## 2019-10-27 MED ORDER — MEDROXYPROGESTERONE ACETATE 150 MG/ML IM SUSP: 150.0000 mg | Freq: Once | INTRAMUSCULAR | Status: AC

## 2019-12-16 ENCOUNTER — Encounter: Payer: Self-pay | Admitting: Family Medicine

## 2019-12-20 NOTE — Progress Notes (Addendum)
Gabriel Cirri, NP   Chief Complaint  Patient presents with  . Pelvic Pain    last 2-3 cycles right before and during cycles, no uti sx    HPI:      Ms. Terrell Shimko is a 39 y.o. [id number] who LMP was Patient's last menstrual period was 12/18/2019 (exact date)., presents today for pelvic pain for the past 3 cycles. Pt has had intermittent, stabbing pains before and during her periods, and with BMs and voiding with her periods. Hurts to even put a tampon in. Can only take tylenol and has a little improvement with it. This cycle had same pains but is now continuing to have constant ache/pain in pelvis, radiating to back.  No constipation/diarrhea sx with pain, no urin, or vag sx. No fevers, no hx of ovar cysts. Is usulaly on depo but stopped it last fall (not sure when last shot was). Likes to take break. Resumed menses month after stopping, lasting 2-3 daysl. Flow has only been 1 1/2 days the past 3 months.  Would ultimately like to go back on depo.  She is sex active, no dyspareunia, postcoital bleeidng. Not using any BC. No new partners (husband only). S/P bariatric surg 2018. Had daily vomiting initially but that has improved.  Pt also with fatigue, irritability recently.   Past Medical History:  Diagnosis Date  . Abnormal glucose   . Anxiety   . Depression   . Obesity   . Seizures (HCC)    stress seizure  . Sleep apnea     Past Surgical History:  Procedure Laterality Date  . GASTRIC ROUX-EN-Y N/A 10/03/2015   Procedure: LAPAROSCOPIC ROUX-EN-Y GASTRIC;  Surgeon: Geoffry Paradise, MD;  Location: [facility] ORS;  Service: General;  Laterality: N/A;    Family History  Problem Relation Age of Onset  . Diabetes Father   . Asthma Daughter   . Stroke Maternal Grandmother   . Atrial fibrillation Maternal Grandmother   . Heart murmur Maternal Grandmother   . Cancer Maternal Grandfather        liver  . Alcohol abuse Paternal Grandfather     Social History   Socioeconomic History  .  Marital status: Married    Spouse name: Not on file  . Number of children: Not on file  . Years of education: Not on file  . Highest education level: Not on file  Occupational History  . Not on file  Tobacco Use  . Smoking status: Never Smoker  . Smokeless tobacco: Never Used  Substance and Sexual Activity  . Alcohol use: No  . Drug use: No  . Sexual activity: Yes    Birth control/protection: None  Other Topics Concern  . Not on file  Social History Narrative  . Not on file   Social Determinants of Health   Financial Resource Strain:   . Difficulty of Paying Living Expenses: Not on file  Food Insecurity:   . Worried About Programme researcher, broadcasting/film/video in the Last Year: Not on file  . Ran Out of Food in the Last Year: Not on file  Transportation Needs:   . Lack of Transportation (Medical): Not on file  . Lack of Transportation (Non-Medical): Not on file  Physical Activity:   . Days of Exercise per Week: Not on file  . Minutes of Exercise per Session: Not on file  Stress:   . Feeling of Stress : Not on file  Social Connections:   . Frequency of Communication with  Friends and Family: Not on file  . Frequency of Social Gatherings with Friends and Family: Not on file  . Attends Religious Services: Not on file  . Active Member of Clubs or Organizations: Not on file  . Attends Banker Meetings: Not on file  . Marital Status: Not on file  Intimate Partner Violence:   . Fear of Current or Ex-Partner: Not on file  . Emotionally Abused: Not on file  . Physically Abused: Not on file  . Sexually Abused: Not on file    Outpatient Medications Prior to Visit  Medication Sig Dispense Refill  . acetaminophen (TYLENOL) 500 MG tablet Take 1,000 mg by mouth every 6 (six) hours as needed.    Marland Kitchen FLUoxetine (PROZAC) 20 MG capsule Take 1 capsule (20 mg total) by mouth daily. 90 capsule 3   Facility-Administered Medications Prior to Visit  Medication Dose Route Frequency Provider Last  Rate Last Admin  . medroxyPROGESTERone (DEPO-PROVERA) injection 150 mg  150 mg Intramuscular Q90 days Particia Nearing, PA-C      . medroxyPROGESTERone (DEPO-PROVERA) injection 150 mg  150 mg Intramuscular Once Particia Nearing, PA-C          ROS:  Review of Systems  Constitutional: Positive for fatigue. Negative for fever and unexpected weight change.  Respiratory: Negative for cough, shortness of breath and wheezing.   Cardiovascular: Negative for chest pain, palpitations and leg swelling.  Gastrointestinal: Negative for blood in stool, constipation, diarrhea, nausea and vomiting.  Endocrine: Negative for cold intolerance, heat intolerance and polyuria.  Genitourinary: Positive for menstrual problem and pelvic pain. Negative for dyspareunia, dysuria, flank pain, frequency, genital sores, hematuria, urgency, vaginal bleeding, vaginal discharge and vaginal pain.  Musculoskeletal: Negative for back pain, joint swelling and myalgias.  Skin: Negative for rash.  Neurological: Negative for dizziness, syncope, light-headedness, numbness and headaches.  Hematological: Negative for adenopathy.  Psychiatric/Behavioral: Negative for agitation, confusion, sleep disturbance and suicidal ideas. The patient is not nervous/anxious.   BREAST: No symptoms   OBJECTIVE:   Vitals:  BP 110/80   Ht 5\' 7"  (1.702 m)   Wt 257 lb (116.6 kg)   LMP 12/18/2019 (Exact Date)   BMI 40.25 kg/m   Physical Exam Vitals reviewed.  Constitutional:      Appearance: She is well-developed.  Pulmonary:     Effort: Pulmonary effort is normal.  Abdominal:     Palpations: Abdomen is soft.     Tenderness: There is abdominal tenderness in the right lower quadrant, suprapubic area and left lower quadrant. There is no guarding or rebound.  Genitourinary:    General: Normal vulva.     Pubic Area: No rash.      Labia:        Right: No rash, tenderness or lesion.        Left: No rash, tenderness or lesion.        Vagina: Normal. No vaginal discharge, erythema or tenderness.     Cervix: Cervical motion tenderness present.     Uterus: Normal. Enlarged. Not tender.      Adnexa:        Right: Tenderness present. No mass.         Left: Tenderness present. No mass.    Musculoskeletal:        General: Normal range of motion.     Cervical back: Normal range of motion.  Skin:    General: Skin is warm and dry.  Neurological:     General: No  focal deficit present.     Mental Status: She is alert and oriented to person, place, and time.  Psychiatric:        Mood and Affect: Mood normal.        Behavior: Behavior normal.        Thought Content: Thought content normal.        Judgment: Judgment normal.     Results:  ULTRASOUND REPORT  Location: [facility]  Date of Service: 12/21/2019    Indications:Pelvic Pain Findings:  The uterus is anteverted and measures 7.5 x 4.1 x 4.1 cm. Echo texture is heterogenous without evidence of focal masses. The Endometrium measures 3.0 mm.  Right Ovary measures 2.8 x 2.6 x 1.9 cm. It is normal in appearance. Left Ovary measures 2.4 x 1.8 x 2.1 cm. It is normal in appearance. Survey of the adnexa demonstrates no adnexal masses. There is no free fluid in the cul de sac.  Impression: 1. The uterus is heterogeneous. 2. Normal appearing cervix and ovaries.   Recommendations: 1.Clinical correlation with the patient's History and Physical Exam.   Gweneth Dimitri, RT  Assessment/Plan: Pelvic pain - Plan: US PELVIS TRANSVAGINAL NON-OB (TV ONLY), POCT urine pregnancy, CT ABDOMEN PELVIS W WO CONTRAST Tender on exam, neg UPT, neg GYN u/s. Pt very uncomfortable. Discussed CT scan to eval further. Pt concerned about GI/bariatric surg hx.   12/23/19 CT appt 12/29/19. Given sx, exam, and severity of pt's sx, will treat empirically for PID while waiting for CT. Also check nuswab for gon/chlam, UA and C&S.  Rx rocephin 250 mg IM, Rx doxy BID for 14 days  and flagyl BID for 14 days. F/u  Prn. If sx severe, go to ED.   No results found for this or any previous visit (from the past 24 hour(s)).     Return if symptoms worsen or fail to improve.  Almus Woodham B. Zaylan Kissoon, PA-C 12/23/2019 12:05 PM

## 2019-12-20 NOTE — Patient Instructions (Signed)
I value your feedback and entrusting us with your care. If you get a [facility] patient survey, I would appreciate you taking the time to let us know about your experience today. Thank you!  As of October 07, 2019, your lab results will be released to your MyChart immediately, before I even have a chance to see them. Please give me time to review them and contact you if there are any abnormalities. Thank you for your patience.  

## 2019-12-21 ENCOUNTER — Encounter: Payer: Self-pay | Admitting: Obstetrics and Gynecology

## 2019-12-21 ENCOUNTER — Other Ambulatory Visit: Payer: Self-pay

## 2019-12-21 ENCOUNTER — Ambulatory Visit (INDEPENDENT_AMBULATORY_CARE_PROVIDER_SITE_OTHER): Payer: Managed Care, Other (non HMO) | Admitting: Obstetrics and Gynecology

## 2019-12-21 ENCOUNTER — Ambulatory Visit (INDEPENDENT_AMBULATORY_CARE_PROVIDER_SITE_OTHER): Payer: Managed Care, Other (non HMO)

## 2019-12-21 VITALS — BP 110/80 | Ht 67.0 in | Wt 257.0 lb

## 2019-12-21 DIAGNOSIS — R1031 Right lower quadrant pain: Secondary | ICD-10-CM | POA: Diagnosis not present

## 2019-12-21 DIAGNOSIS — R102 Pelvic and perineal pain: Secondary | ICD-10-CM | POA: Diagnosis not present

## 2019-12-21 DIAGNOSIS — Z113 Encounter for screening for infections with a predominantly sexual mode of transmission: Secondary | ICD-10-CM | POA: Diagnosis not present

## 2019-12-21 DIAGNOSIS — Z3202 Encounter for pregnancy test, result negative: Secondary | ICD-10-CM

## 2019-12-21 DIAGNOSIS — R1032 Left lower quadrant pain: Secondary | ICD-10-CM

## 2019-12-21 LAB — POCT URINE PREGNANCY: Preg Test, Ur: NEGATIVE

## 2019-12-22 ENCOUNTER — Encounter: Payer: Self-pay | Admitting: Obstetrics and Gynecology

## 2019-12-22 ENCOUNTER — Telehealth: Payer: Self-pay | Admitting: Obstetrics and Gynecology

## 2019-12-22 NOTE — Telephone Encounter (Signed)
Patient is calling to speak with Audrey Wolfe due to Missed call. Please advise

## 2019-12-22 NOTE — Telephone Encounter (Signed)
Done

## 2019-12-23 ENCOUNTER — Other Ambulatory Visit: Payer: Self-pay

## 2019-12-23 ENCOUNTER — Other Ambulatory Visit (HOSPITAL_COMMUNITY)
Admission: RE | Admit: 2019-12-23 | Discharge: 2019-12-23 | Disposition: A | Discharge status: Home / Self Care | Payer: Managed Care, Other (non HMO) | Source: Ambulatory Visit | Attending: Obstetrics and Gynecology | Admitting: Obstetrics and Gynecology

## 2019-12-23 ENCOUNTER — Ambulatory Visit (INDEPENDENT_AMBULATORY_CARE_PROVIDER_SITE_OTHER): Payer: Managed Care, Other (non HMO)

## 2019-12-23 DIAGNOSIS — R102 Pelvic and perineal pain: Secondary | ICD-10-CM | POA: Insufficient documentation

## 2019-12-23 DIAGNOSIS — Z113 Encounter for screening for infections with a predominantly sexual mode of transmission: Secondary | ICD-10-CM | POA: Insufficient documentation

## 2019-12-23 LAB — POCT URINALYSIS DIPSTICK
Bilirubin, UA: NEGATIVE
Blood, UA: NEGATIVE
Glucose, UA: NEGATIVE
Ketones, UA: NEGATIVE
Nitrite, UA: NEGATIVE
Protein, UA: NEGATIVE
Spec Grav, UA: 1.015 (ref 1.010–1.025)
Urobilinogen, UA: NEGATIVE E.U./dL — AB
pH, UA: 6 (ref 5.0–8.0)

## 2019-12-23 MED ORDER — CEFTRIAXONE SODIUM 250 MG IJ SOLR
250.0000 mg | Freq: Once | INTRAMUSCULAR | Status: AC
  Administered 2019-12-23: 250 mg via INTRAMUSCULAR

## 2019-12-23 MED ORDER — DOXYCYCLINE HYCLATE 100 MG PO CAPS: 100.0000 mg | ORAL_CAPSULE | Freq: Two times a day (BID) | ORAL | 0 refills | Status: AC

## 2019-12-23 MED ORDER — METRONIDAZOLE 500 MG PO TABS: 500.0000 mg | ORAL_TABLET | Freq: Two times a day (BID) | ORAL | 0 refills | Status: AC

## 2019-12-23 MED ORDER — CEFTRIAXONE SODIUM 250 MG IJ SOLR: 250.0000 mg | Freq: Once | INTRAMUSCULAR | Status: AC

## 2019-12-23 NOTE — Addendum Note (Signed)
Addended by: Althea Grimmer B on: 12/23/2019 02:51 PM   Modules accepted: Orders

## 2019-12-23 NOTE — Addendum Note (Signed)
Addended by: Althea Grimmer B on: 12/23/2019 12:22 PM   Modules accepted: Orders

## 2019-12-23 NOTE — Progress Notes (Signed)
Pt here for inj of Rocephin 250mg  which was reconstituted c 0.71ml 1% xylocaine, Lot # 10m, exp: 02/2022. [facility] wanted pt to also collect nuswab specimen which pt did.  Pt also wanted to collect CCU to test for anything [facility] might want to test for that nuswab didn't. Please see urinalysis results.  [facility] ordered nuswab/aptima thru [facility] and urine C&S.

## 2019-12-25 ENCOUNTER — Encounter: Payer: Self-pay | Admitting: Obstetrics and Gynecology

## 2019-12-25 LAB — URINE CULTURE

## 2019-12-27 ENCOUNTER — Other Ambulatory Visit: Payer: Self-pay

## 2019-12-27 ENCOUNTER — Ambulatory Visit (INDEPENDENT_AMBULATORY_CARE_PROVIDER_SITE_OTHER): Payer: Managed Care, Other (non HMO) | Admitting: Family Medicine

## 2019-12-27 ENCOUNTER — Encounter: Payer: Self-pay | Admitting: Family Medicine

## 2019-12-27 VITALS — BP 114/70 | HR 67 | Temp 98.5°F | Ht 64.5 in | Wt 254.0 lb

## 2019-12-27 DIAGNOSIS — Z0000 Encounter for general adult medical examination without abnormal findings: Secondary | ICD-10-CM | POA: Diagnosis not present

## 2019-12-27 DIAGNOSIS — F419 Anxiety disorder, unspecified: Secondary | ICD-10-CM | POA: Diagnosis not present

## 2019-12-27 DIAGNOSIS — Z3042 Encounter for surveillance of injectable contraceptive: Secondary | ICD-10-CM

## 2019-12-27 DIAGNOSIS — F329 Major depressive disorder, single episode, unspecified: Secondary | ICD-10-CM

## 2019-12-27 LAB — CERVICOVAGINAL ANCILLARY ONLY
Chlamydia: NEGATIVE
Comment: NEGATIVE
Comment: NORMAL
Neisseria Gonorrhea: NEGATIVE

## 2019-12-27 NOTE — Progress Notes (Signed)
BP 114/70   Pulse 67   Temp 98.5 F (36.9 C) (Oral)   Ht 5' 4.5" (1.638 m)   Wt 254 lb (115.2 kg)   LMP 12/18/2019 (Exact Date)   SpO2 98%   BMI 42.93 kg/m    Subjective:    Patient ID: Audrey Wolfe, female    DOB: [birth year], 39 y.o.   MRN: [id number]  HPI: Audrey Wolfe is a 39 y.o. female presenting on 12/27/2019 for comprehensive medical examination. Current medical complaints include:see below  Continues to do well on depo injection, last pregnancy test last week - negative. Due for one now.  Prozac doing well for moods and anxiety, tolerating well without side effects. Denies SI/HI.   Depression screen [facility] 2/9 10/13/2019 08/13/2017 05/29/2016  Decreased Interest 0 0 0  Down, Depressed, Hopeless 0 0 0  PHQ - 2 Score 0 0 0  Altered sleeping 0 - -  Tired, decreased energy 1 - -  Change in appetite 0 - -  Feeling bad or failure about yourself  0 - -  Trouble concentrating 0 - -  Moving slowly or fidgety/restless 0 - -  Suicidal thoughts 0 - -  PHQ-9 Score 1 - -  Difficult doing work/chores Not difficult at all - -   GAD 7 : Generalized Anxiety Score 10/13/2019  Nervous, Anxious, on Edge 0  Control/stop worrying 0  Worry too much - different things 1  Trouble relaxing 0  Restless 0  Easily annoyed or irritable 1  Afraid - awful might happen 0  Total GAD 7 Score 2  Anxiety Difficulty Not difficult at all     She currently lives with: Menopausal Symptoms: no  Depression Screen done today and results listed below:  Depression screen [facility] 2/9 10/13/2019 08/13/2017 05/29/2016  Decreased Interest 0 0 0  Down, Depressed, Hopeless 0 0 0  PHQ - 2 Score 0 0 0  Altered sleeping 0 - -  Tired, decreased energy 1 - -  Change in appetite 0 - -  Feeling bad or failure about yourself  0 - -  Trouble concentrating 0 - -  Moving slowly or fidgety/restless 0 - -  Suicidal thoughts 0 - -  PHQ-9 Score 1 - -  Difficult doing work/chores Not difficult at all - -    The patient  does not have a history of falls. I did complete a risk assessment for falls. A plan of care for falls was not documented.   Past Medical History:  Past Medical History:  Diagnosis Date  . Abnormal glucose   . Anxiety   . Depression   . Obesity   . Seizures (HCC)    stress seizure  . Sleep apnea     Surgical History:  Past Surgical History:  Procedure Laterality Date  . 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 acetaminophen (TYLENOL) 500 MG tablet Take 1,000 mg by mouth every 6 (six) hours as needed.  . doxycycline (VIBRAMYCIN) 100 MG capsule Take 1 capsule (100 mg total) by mouth 2 (two) times daily for 14 days.  Marland Kitchen FLUoxetine (PROZAC) 20 MG capsule Take 1 capsule (20 mg total) by mouth daily.  . metroNIDAZOLE (FLAGYL) 500 MG tablet Take 1 tablet (500 mg total) by mouth 2 (two) times daily  for 14 days.   Current Facility-Administered Medications on File Prior to Visit  Medication  . cefTRIAXone (ROCEPHIN) injection 250 mg  . medroxyPROGESTERone (DEPO-PROVERA) injection 150 mg  . medroxyPROGESTERone (DEPO-PROVERA) injection 150 mg    Allergies:  Allergies  Allergen Reactions  . Penicillins Rash    Childhood; pt has taken amox without allergic rxn    Social History:  Social History   Socioeconomic History  . Marital status: Married    Spouse name: Not on file  . Number of children: Not on file  . Years of education: Not on file  . Highest education level: Not on file  Occupational History  . Not on file  Tobacco Use  . Smoking status: Never Smoker  . Smokeless tobacco: Never Used  Substance and Sexual Activity  . Alcohol use: No  . Drug use: No  . Sexual activity: Yes    Birth control/protection: None  Other Topics Concern  . Not on file  Social  History Narrative  . Not on file   Social Determinants of Health   Financial Resource Strain:   . Difficulty of Paying Living Expenses: Not on file  Food Insecurity:   . Worried About Programme researcher, broadcasting/film/video in the Last Year: Not on file  . Ran Out of Food in the Last Year: Not on file  Transportation Needs:   . Lack of Transportation (Medical): Not on file  . Lack of Transportation (Non-Medical): Not on file  Physical Activity:   . Days of Exercise per Week: Not on file  . Minutes of Exercise per Session: Not on file  Stress:   . Feeling of Stress : Not on file  Social Connections:   . Frequency of Communication with Friends and Family: Not on file  . Frequency of Social Gatherings with Friends and Family: Not on file  . Attends Religious Services: Not on file  . Active Member of Clubs or Organizations: Not on file  . Attends Banker Meetings: Not on file  . Marital Status: Not on file  Intimate Partner Violence:   . Fear of Current or Ex-Partner: Not on file  . Emotionally Abused: Not on file  . Physically Abused: Not on file  . Sexually Abused: Not on file   Social History   Tobacco Use  Smoking Status Never Smoker  Smokeless Tobacco Never Used   Social History   Substance and Sexual Activity  Alcohol Use No    Family History:  Family History  Problem Relation Age of Onset  . Diabetes Father   . Asthma Daughter   . Stroke Maternal Grandmother   . Atrial fibrillation Maternal Grandmother   . Heart murmur Maternal Grandmother   . Cancer Maternal Grandfather        liver  . Alcohol abuse Paternal Grandfather     Past medical history, surgical history, medications, allergies, family history and social history reviewed with patient today and changes made to appropriate areas of the chart.   Review of Systems - General ROS: negative Psychological ROS: negative Ophthalmic ROS: negative ENT ROS: negative Allergy and Immunology ROS:  negative Hematological and Lymphatic ROS: negative Endocrine ROS: negative Breast ROS: negative for breast lumps Respiratory ROS: no cough, shortness of breath, or wheezing Cardiovascular ROS: no chest pain or dyspnea on exertion Gastrointestinal ROS: no abdominal pain, change in bowel habits, or black or bloody stools Genito-Urinary ROS: no dysuria, trouble voiding, or hematuria Musculoskeletal ROS: negative Neurological ROS: no TIA or  stroke symptoms Dermatological ROS: negative All other ROS negative except what is listed above and in the HPI.      Objective:    BP 114/70   Pulse 67   Temp 98.5 F (36.9 C) (Oral)   Ht 5' 4.5" (1.638 m)   Wt 254 lb (115.2 kg)   LMP 12/18/2019 (Exact Date)   SpO2 98%   BMI 42.93 kg/m   Wt Readings from Last 3 Encounters:  12/27/19 254 lb (115.2 kg)  12/21/19 257 lb (116.6 kg)  10/13/19 250 lb (113.4 kg)    Physical Exam Vitals and nursing note reviewed.  Constitutional:      General: She is not in acute distress.    Appearance: She is well-developed.  HENT:     Head: Atraumatic.     Right Ear: External ear normal.     Left Ear: External ear normal.     Nose: Nose normal.     Mouth/Throat:     Pharynx: No oropharyngeal exudate.  Eyes:     General: No scleral icterus.    Conjunctiva/sclera: Conjunctivae normal.     Pupils: Pupils are equal, round, and reactive to light.  Neck:     Thyroid: No thyromegaly.  Cardiovascular:     Rate and Rhythm: Normal rate and regular rhythm.     Heart sounds: Normal heart sounds.  Pulmonary:     Effort: Pulmonary effort is normal. No respiratory distress.     Breath sounds: Normal breath sounds.  Chest:     Comments: Breast exam done through GYN Abdominal:     General: Bowel sounds are normal.     Palpations: Abdomen is soft. There is no mass.     Tenderness: There is no abdominal tenderness.  Genitourinary:    Comments: Done through GYN Musculoskeletal:        General: No tenderness.  Normal range of motion.     Cervical back: Normal range of motion and neck supple.  Lymphadenopathy:     Cervical: No cervical adenopathy.  Skin:    General: Skin is warm and dry.     Findings: No rash.  Neurological:     Mental Status: She is alert and oriented to person, place, and time.     Cranial Nerves: No cranial nerve deficit.  Psychiatric:        Behavior: Behavior normal.    Results for orders placed or performed in visit on 12/23/19  Urine Culture   Specimen: Urine, Clean Catch   UR  Result Value Ref Range   Urine Culture, Routine Final report    Organism ID, Bacteria Comment       Assessment & Plan:   Problem List Items Addressed This Visit      Other   Anxiety and depression - Primary    Stable and well controlled, continue current regimen      Encounter for surveillance of injectable contraceptive    Urine preg neg last week, administer depo injection today       Other Visit Diagnoses    Annual physical exam       Relevant Orders   CBC with Differential/Platelet   Comprehensive metabolic panel   Lipid Panel w/o Chol/HDL Ratio   UA/M w/rflx Culture, Routine   TSH       Follow up plan: Return in about 1 year (around 12/26/2020) for CPE.   LABORATORY TESTING:  - Pap smear: up to date  IMMUNIZATIONS:   - Tdap: Tetanus vaccination  status reviewed: last tetanus booster within 10 years. - Influenza: Refused  SCREENING: -Mammogram: postponed   PATIENT COUNSELING:   Advised to take 1 mg of folate supplement per day if capable of pregnancy.   Sexuality: Discussed sexually transmitted diseases, partner selection, use of condoms, avoidance of unintended pregnancy  and contraceptive alternatives.   Advised to avoid cigarette smoking.  I discussed with the patient that most people either abstain from alcohol or drink within safe limits (<=14/week and <=4 drinks/occasion for males, <=7/weeks and <= 3 drinks/occasion for females) and that the risk for  alcohol disorders and other health effects rises proportionally with the number of drinks per week and how often a drinker exceeds daily limits.  Discussed cessation/primary prevention of drug use and availability of treatment for abuse.   Diet: Encouraged to adjust caloric intake to maintain  or achieve ideal body weight, to reduce intake of dietary saturated fat and total fat, to limit sodium intake by avoiding high sodium foods and not adding table salt, and to maintain adequate dietary potassium and calcium preferably from fresh fruits, vegetables, and low-fat dairy products.    stressed the importance of regular exercise  Injury prevention: Discussed safety belts, safety helmets, smoke detector, smoking near bedding or upholstery.   Dental health: Discussed importance of regular tooth brushing, flossing, and dental visits.    NEXT PREVENTATIVE PHYSICAL DUE IN 1 YEAR. Return in about 1 year (around 12/26/2020) for CPE.

## 2019-12-27 NOTE — Assessment & Plan Note (Signed)
Urine preg neg last week, administer depo injection today

## 2019-12-27 NOTE — Assessment & Plan Note (Signed)
Stable and well controlled, continue current regimen 

## 2019-12-28 LAB — COMPREHENSIVE METABOLIC PANEL
ALT: 24 IU/L (ref 0–32)
AST: 27 IU/L (ref 0–40)
Albumin/Globulin Ratio: 1.8 (ref 1.2–2.2)
Albumin: 4.2 g/dL (ref 3.8–4.8)
Alkaline Phosphatase: 71 IU/L (ref 39–117)
BUN/Creatinine Ratio: 20 (ref 9–23)
BUN: 16 mg/dL (ref 6–24)
Bilirubin Total: <0.2 mg/dL (ref 0.0–1.2)
CO2: 24 mmol/L (ref 20–29)
Calcium: 8.8 mg/dL (ref 8.7–10.2)
Chloride: 101 mmol/L (ref 96–106)
Creatinine, Ser: 0.81 mg/dL (ref 0.57–1.00)
GFR calc Af Amer: 105 mL/min/{1.73_m2} (ref >59)
GFR calc non Af Amer: 91 mL/min/{1.73_m2} (ref >59)
Globulin, Total: 2.4 g/dL (ref 1.5–4.5)
Glucose: 70 mg/dL (ref 65–99)
Potassium: 4.4 mmol/L (ref 3.5–5.2)
Sodium: 138 mmol/L (ref 134–144)
Total Protein: 6.6 g/dL (ref 6.0–8.5)

## 2019-12-28 LAB — CBC WITH DIFFERENTIAL/PLATELET
Basophils Absolute: 0 10*3/uL (ref 0.0–0.2)
Basos: 1 %
EOS (ABSOLUTE): 0.1 10*3/uL (ref 0.0–0.4)
Eos: 2 %
Hematocrit: 35.3 % (ref 34.0–46.6)
Hemoglobin: 11.1 g/dL (ref 11.1–15.9)
Immature Grans (Abs): 0 10*3/uL (ref 0.0–0.1)
Immature Granulocytes: 0 %
Lymphocytes Absolute: 1.7 10*3/uL (ref 0.7–3.1)
Lymphs: 30 %
MCH: 25.3 pg — ABNORMAL LOW (ref 26.6–33.0)
MCHC: 31.4 g/dL — ABNORMAL LOW (ref 31.5–35.7)
MCV: 81 fL (ref 79–97)
Monocytes Absolute: 0.6 10*3/uL (ref 0.1–0.9)
Monocytes: 10 %
Neutrophils Absolute: 3.3 10*3/uL (ref 1.4–7.0)
Neutrophils: 57 %
Platelets: 254 10*3/uL (ref 150–450)
RBC: 4.38 x10E6/uL (ref 3.77–5.28)
RDW: 15 % (ref 11.7–15.4)
WBC: 5.6 10*3/uL (ref 3.4–10.8)

## 2019-12-28 LAB — LIPID PANEL W/O CHOL/HDL RATIO
Cholesterol, Total: 145 mg/dL (ref 100–199)
HDL: 64 mg/dL (ref >39)
LDL Chol Calc (NIH): 69 mg/dL (ref 0–99)
Triglycerides: 57 mg/dL (ref 0–149)
VLDL Cholesterol Cal: 12 mg/dL (ref 5–40)

## 2019-12-28 LAB — TSH: TSH: 1.04 u[IU]/mL (ref 0.450–4.500)

## 2019-12-28 NOTE — Telephone Encounter (Signed)
Pls cancel CT for 12/29/19. Thx

## 2019-12-29 ENCOUNTER — Ambulatory Visit: Payer: Managed Care, Other (non HMO)

## 2020-06-13 ENCOUNTER — Encounter: Payer: Self-pay | Admitting: Family Medicine

## 2020-08-09 ENCOUNTER — Other Ambulatory Visit: Payer: Self-pay | Admitting: Family Medicine

## 2020-08-09 NOTE — Telephone Encounter (Signed)
Requested medications are due for refill today?  No    Requested medications are on active medication list?  No   Last Refill:   10/13/2019  # 90 with 3 refills   Future visit scheduled?  Yes in 4 months.    Notes to Clinic:  Medication failed Rx refill protocol due to no valid encounter in the past 6 months.    Last two to three visits have been with Roosvelt Maser, PA-C

## 2020-12-27 ENCOUNTER — Encounter: Payer: Managed Care, Other (non HMO) | Admitting: Family Medicine

## 2021-09-15 ENCOUNTER — Other Ambulatory Visit: Payer: Self-pay | Admitting: Unknown Physician Specialty

## 2021-09-15 NOTE — Telephone Encounter (Signed)
Requested medication (s) are due for refill today: yes  Requested medication (s) are on the active medication list: yes  Last refill:  08/09/20 #90 3 RF  Future visit scheduled: no  Notes to clinic:   called pt and LM on VM to make appt//overdue lab work   Requested Prescriptions  Pending Prescriptions Disp Refills   FLUoxetine (PROZAC) 20 MG capsule [Pharmacy Med Name: FLUOXETINE HCL 20 MG CAPSULE] 90 capsule 3    Sig: TAKE 1 CAPSULE BY MOUTH EVERY DAY     Psychiatry:  Antidepressants - SSRI Failed - 09/15/2021 10:31 AM      Failed - Completed PHQ-2 or PHQ-9 in the last 360 days      Failed - Valid encounter within last 6 months    Recent Outpatient Visits           1 year ago Anxiety and depression   [facility] Particia Nearing, [place]   1 year ago Anxiety and depression   [facility] Particia Nearing, [place]   3 years ago Acute right-sided low back pain without sciatica   [facility] Particia Nearing, [place]   3 years ago Glossitis   [facility] Gabriel Cirri, NP   4 years ago Anxiety and depression   [facility] Particia N[place], [place]

## 2021-09-17 NOTE — Telephone Encounter (Signed)
Lmom for patient to schedule appointment. 

## 2021-12-15 ENCOUNTER — Emergency Department: Payer: 59

## 2021-12-15 ENCOUNTER — Emergency Department
Admission: EM | Admit: 2021-12-15 | Discharge: 2021-12-15 | Disposition: A | Discharge status: Home / Self Care | Payer: 59 | Attending: Emergency Medicine | Admitting: Emergency Medicine

## 2021-12-15 DIAGNOSIS — R079 Chest pain, unspecified: Secondary | ICD-10-CM | POA: Diagnosis present

## 2021-12-15 DIAGNOSIS — Z5321 Procedure and treatment not carried out due to patient leaving prior to being seen by health care provider: Secondary | ICD-10-CM | POA: Insufficient documentation

## 2021-12-15 DIAGNOSIS — R0602 Shortness of breath: Secondary | ICD-10-CM | POA: Diagnosis not present

## 2021-12-15 LAB — PROTIME-INR
INR: 0.9 (ref 0.8–1.2)
Prothrombin Time: 12.2 seconds (ref 11.4–15.2)

## 2021-12-15 LAB — TROPONIN I (HIGH SENSITIVITY): Troponin I (High Sensitivity): <2 ng/L (ref <18)

## 2021-12-15 LAB — BASIC METABOLIC PANEL
Anion gap: 9 (ref 5–15)
BUN: 16 mg/dL (ref 6–20)
CO2: 24 mmol/L (ref 22–32)
Calcium: 8.7 mg/dL — ABNORMAL LOW (ref 8.9–10.3)
Chloride: 102 mmol/L (ref 98–111)
Creatinine, Ser: 0.85 mg/dL (ref 0.44–1.00)
GFR, Estimated: >60 mL/min (ref >60)
Glucose, Bld: 119 mg/dL — ABNORMAL HIGH (ref 70–99)
Potassium: 3.7 mmol/L (ref 3.5–5.1)
Sodium: 135 mmol/L (ref 135–145)

## 2021-12-15 LAB — CBC
HCT: 34.7 % — ABNORMAL LOW (ref 36.0–46.0)
Hemoglobin: 10.2 g/dL — ABNORMAL LOW (ref 12.0–15.0)
MCH: 22.2 pg — ABNORMAL LOW (ref 26.0–34.0)
MCHC: 29.4 g/dL — ABNORMAL LOW (ref 30.0–36.0)
MCV: 75.4 fL — ABNORMAL LOW (ref 80.0–100.0)
Platelets: 332 10*3/uL (ref 150–400)
RBC: 4.6 MIL/uL (ref 3.87–5.11)
RDW: 16.9 % — ABNORMAL HIGH (ref 11.5–15.5)
WBC: 6.7 10*3/uL (ref 4.0–10.5)
nRBC: 0 % (ref 0.0–0.2)

## 2021-12-15 LAB — PREGNANCY, URINE: Preg Test, Ur: NEGATIVE

## 2021-12-15 NOTE — ED Triage Notes (Signed)
Pt sts that she has been having chest pain with SOB while ambulating for the last couple of nights. Pt is ambulatory with no signs of distress while in triage.

## 2021-12-15 NOTE — ED Notes (Signed)
This RN notified by registration pt left.

## 2022-03-01 IMAGING — CR DG CHEST 2V
2 series · 2 of 2 positions shown · non-contrast
Comparison: None.

CLINICAL DATA: Chest pain.

EXAM:
CHEST - 2 VIEW

[chest pa]
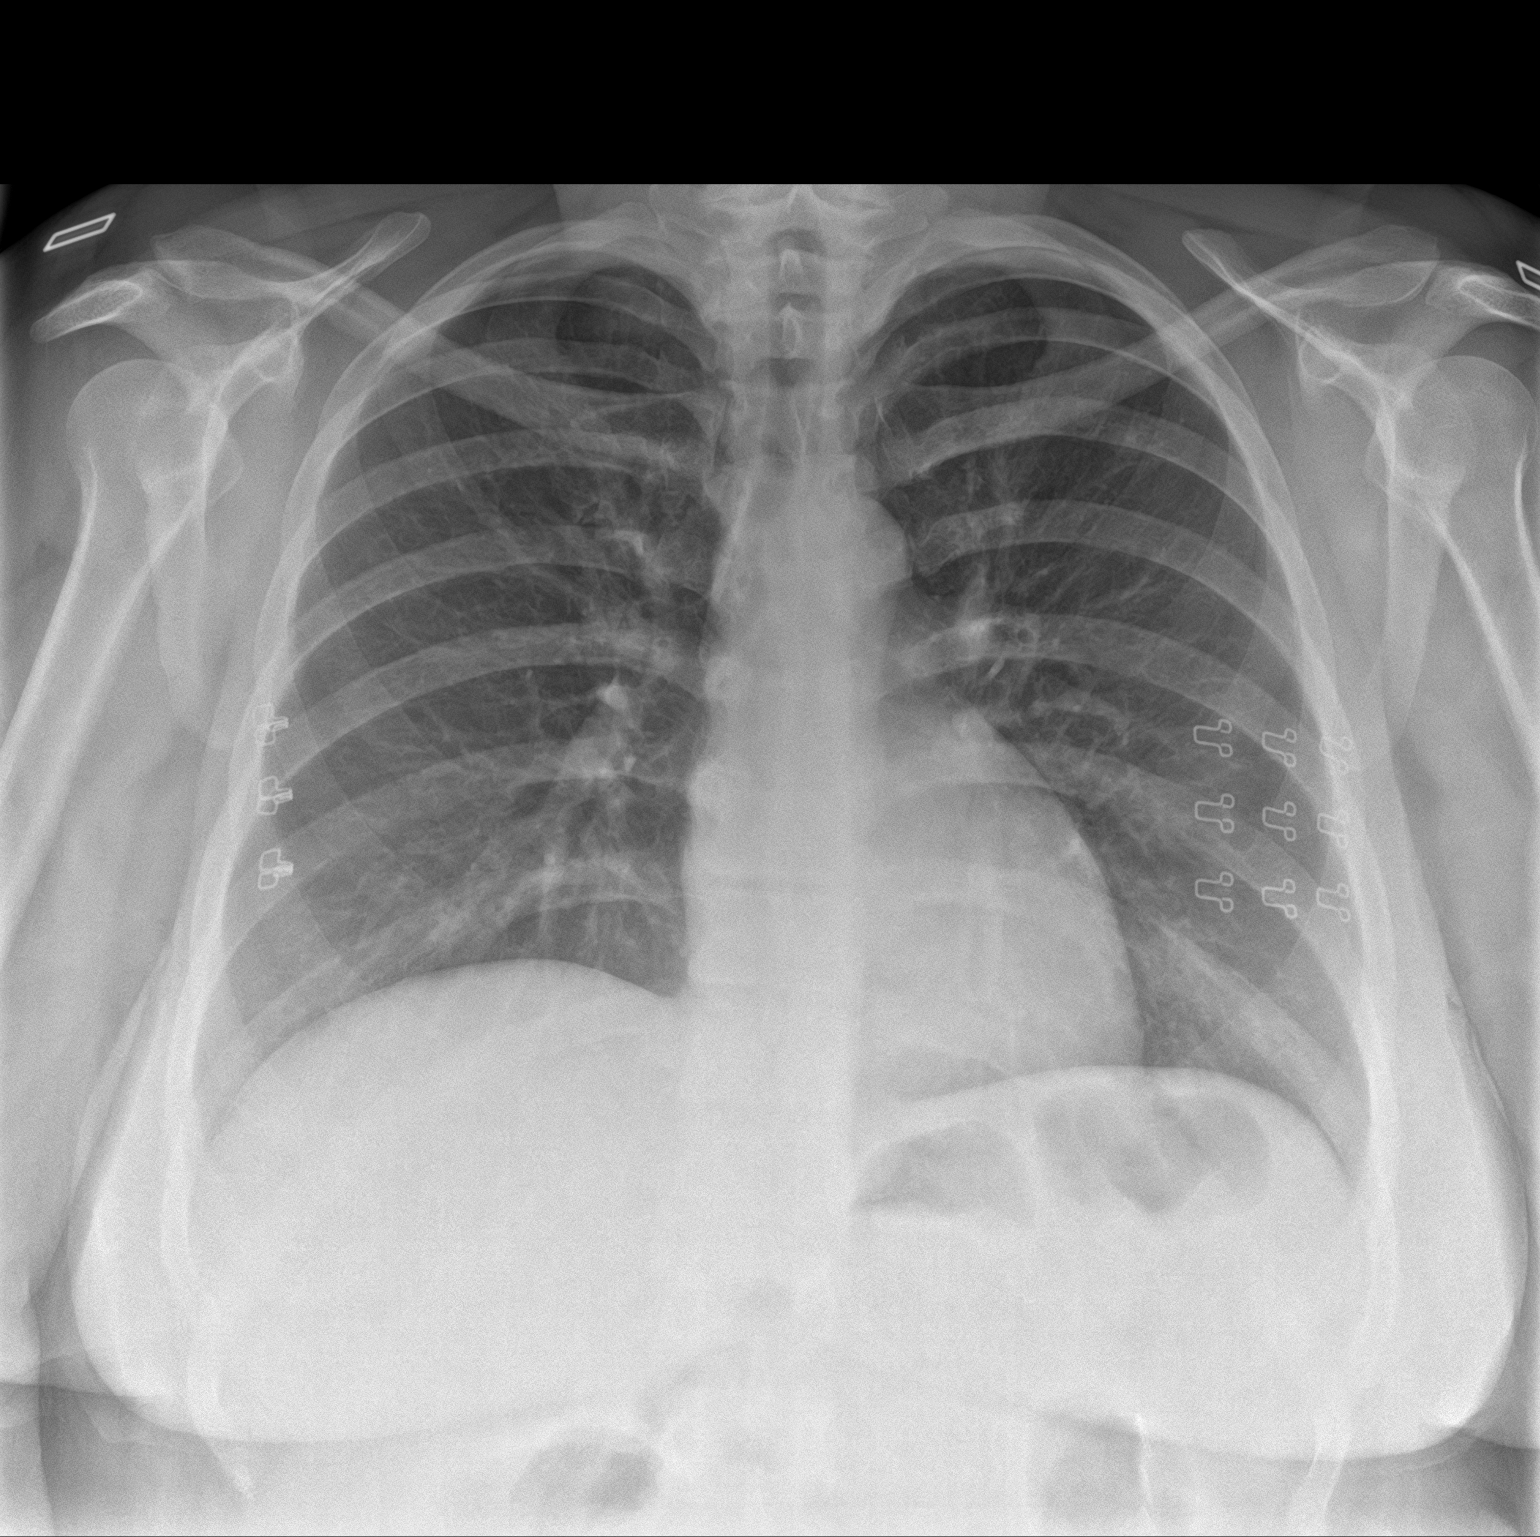

[chest lat]
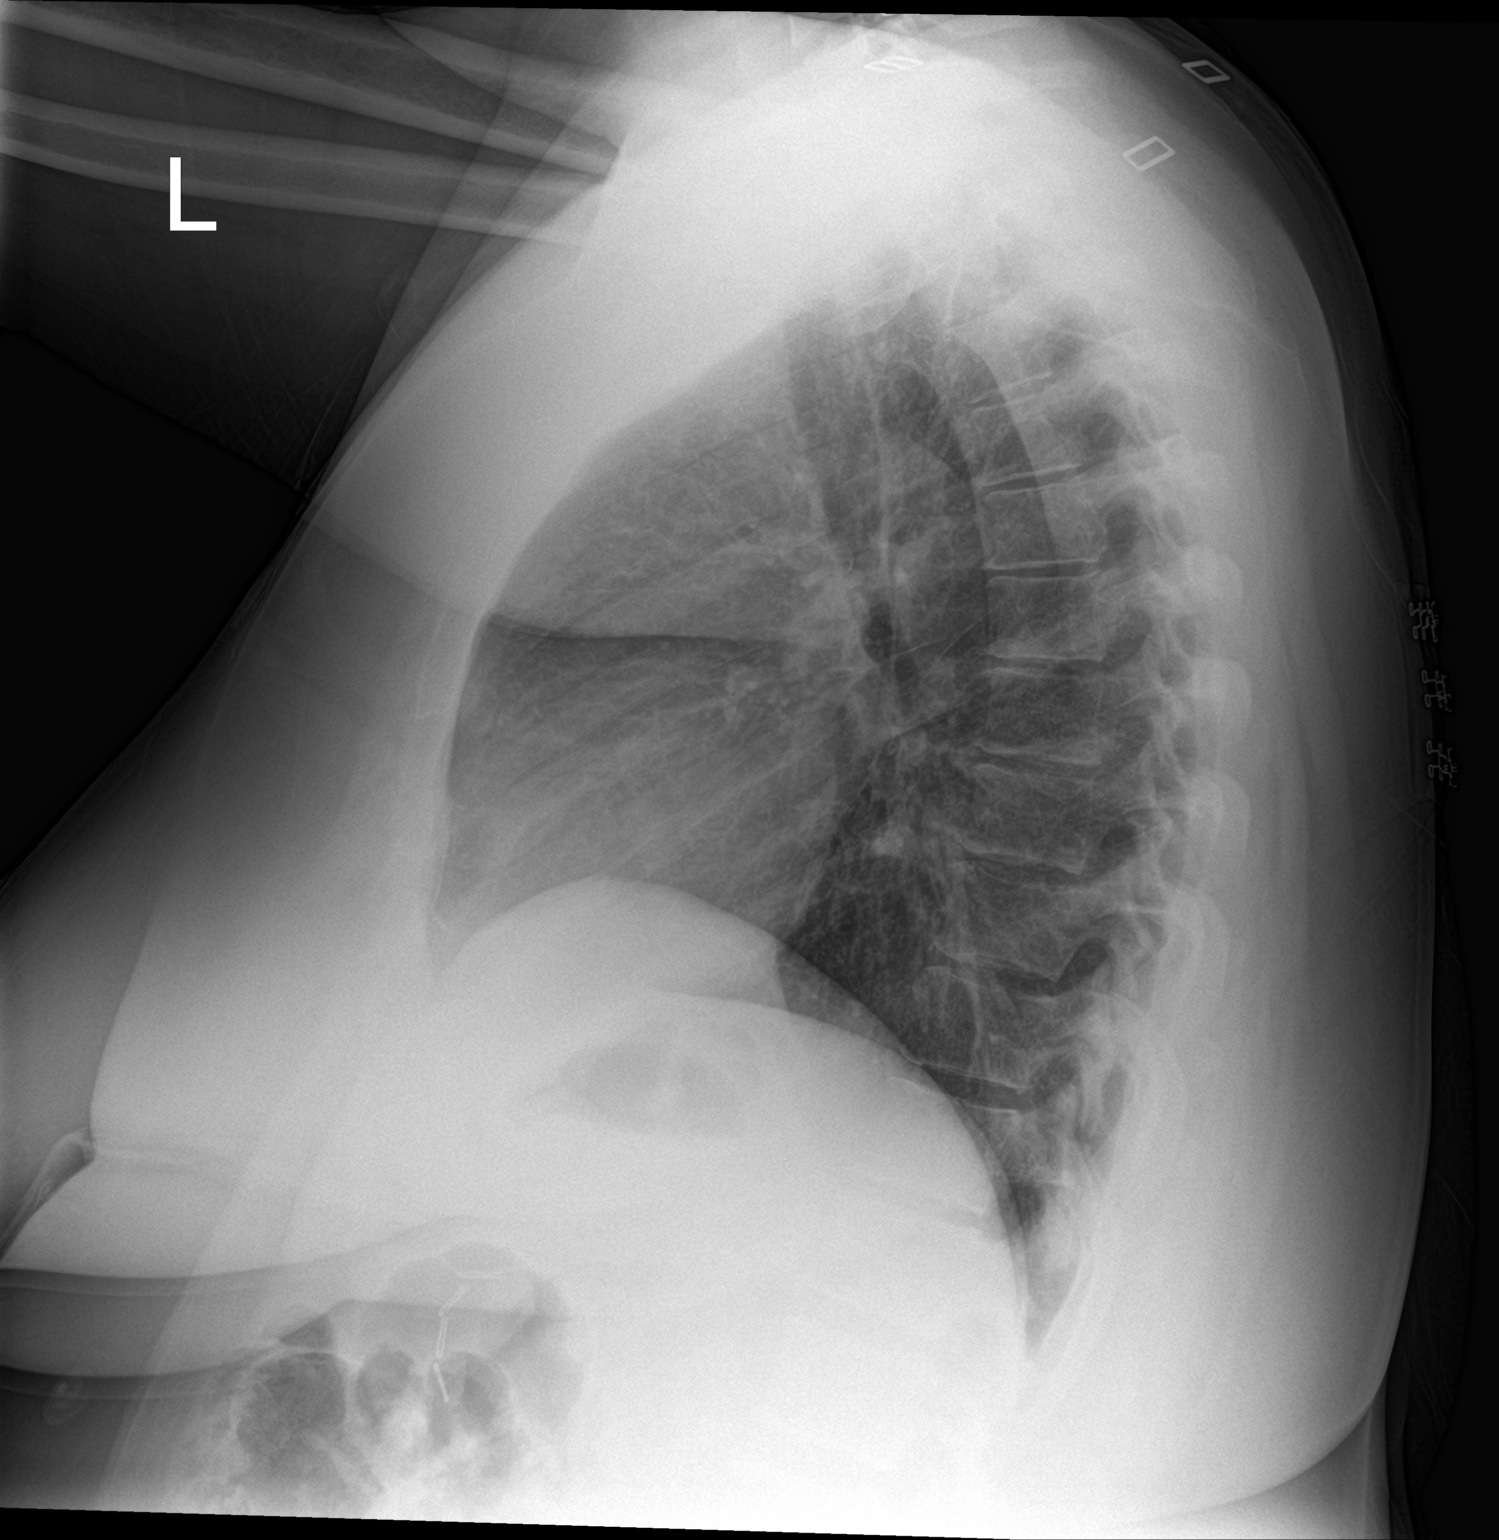

[2 of 2 positions shown; findings below may reference images not displayed]

FINDINGS: The heart size and mediastinal contours are within normal limits.
Both lungs are clear. Multiple radiopaque surgical clips are seen
within the abdomen on the lateral view. The visualized skeletal
structures are unremarkable.
IMPRESSION: No active cardiopulmonary disease.

## 2022-07-12 ENCOUNTER — Encounter: Admission: RE | Payer: Self-pay | Source: Home / Self Care

## 2022-07-12 ENCOUNTER — Ambulatory Visit: Admission: RE | Admit: 2022-07-12 | Payer: 59 | Source: Home / Self Care | Admitting: Gastroenterology

## 2022-07-12 SURGERY — COLONOSCOPY
Anesthesia: General

## 2024-06-02 ENCOUNTER — Inpatient Hospital Stay

## 2024-06-02 ENCOUNTER — Inpatient Hospital Stay: Attending: Oncology | Admitting: Internal Medicine

## 2024-06-02 VITALS — BP 111/57 | HR 84 | Temp 97.8°F | Resp 18 | Ht 66.5 in | Wt 272.3 lb

## 2024-06-02 DIAGNOSIS — E538 Deficiency of other specified B group vitamins: Secondary | ICD-10-CM | POA: Insufficient documentation

## 2024-06-02 DIAGNOSIS — D649 Anemia, unspecified: Secondary | ICD-10-CM | POA: Insufficient documentation

## 2024-06-02 DIAGNOSIS — D509 Iron deficiency anemia, unspecified: Secondary | ICD-10-CM | POA: Diagnosis present

## 2024-06-02 NOTE — Progress Notes (Signed)
 [facility] CONSULT NOTE  Patient Care Team: Daphane Rosella, NP (Inactive) as PCP - General (Nurse Practitioner) Daphane Rosella, NP (Inactive) as PCP - Family Medicine (Nurse Practitioner) Rennie Cindy SAUNDERS, MD as Consulting Physician (Oncology)  CHIEF COMPLAINTS/PURPOSE OF CONSULTATION: ANEMIA   HEMATOLOGY HISTORY  # ANEMIA[Hb; MCV-platelets- WBC; Iron sat; ferritin;  GFR- CT/US - ;   HISTORY OF PRESENTING ILLNESS: Patient ambulating-independently. Alone   Audrey Wolfe 43 y.o.  female pleasant patient is history of gastric bypass 2016 been referred to us  for further evaluation of anemia.  Patient complains of shortness of breath with exertion.  Also complains of excessive fatigue.  Not very compliant with multivitamin.  Recently started back on B12 supplements.   Review of Systems  Constitutional:  Positive for malaise/fatigue. Negative for chills, diaphoresis, fever and weight loss.  HENT:  Negative for nosebleeds and sore throat.   Eyes:  Negative for double vision.  Respiratory:  Negative for cough, hemoptysis, sputum production, shortness of breath and wheezing.   Cardiovascular:  Negative for chest pain, palpitations, orthopnea and leg swelling.  Gastrointestinal:  Negative for abdominal pain, blood in stool, constipation, diarrhea, heartburn, melena, nausea and vomiting.  Genitourinary:  Negative for dysuria, frequency and urgency.  Musculoskeletal:  Negative for back pain and joint pain.  Skin: Negative.  Negative for itching and rash.  Neurological:  Negative for dizziness, tingling, focal weakness, weakness and headaches.  Endo/Heme/Allergies:  Does not bruise/bleed easily.  Psychiatric/Behavioral:  Negative for depression. The patient is not nervous/anxious and does not have insomnia.      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   Procedure: LAPAROSCOPIC ROUX-EN-Y GASTRIC;  Surgeon: Thom CHRISTELLA Pin, MD;  Location: [facility] ORS;  Service: General;  Laterality: N/A;    SOCIAL HISTORY: Social History   Socioeconomic History   Marital status: Married    Spouse name: Not on file   Number of children: Not on file   Years of education: Not on file   Highest education level: Not on file  Occupational History   Not on file  Tobacco Use   Smoking status: Never   Smokeless tobacco: Never  Vaping Use   Vaping status: Never Used  Substance and Sexual Activity   Alcohol use: No   Drug use: No   Sexual activity: Yes    Birth control/protection: None  Other Topics Concern   Not on file  Social History Narrative   Not on file   Social Drivers of Health   Financial Resource Strain: Low Risk  (02/27/2024)   Received from [facility]   Overall Financial Resource Strain (CARDIA)    Difficulty of Paying Living Expenses: Not hard at all  Food Insecurity: No Food Insecurity (06/02/2024)   Hunger Vital Sign    Worried About Running Out of Food in the Last Year: Never true    Ran Out of Food in the Last Year: Never true  Transportation Needs: No Transportation Needs (06/02/2024)   PRAPARE - Administrator, Civil Service (Medical): No    Lack of Transportation (Non-Medical): No  Physical Activity: Inactive (10/13/2017)   Exercise Vital Sign    Days of Exercise  per Week: 0 days    Minutes of Exercise per Session: 0 min  Stress: No Stress Concern Present (10/13/2017)   [employer] of Occupational Health - Occupational Stress Questionnaire    Feeling of Stress : Not at all  Social Connections: Not on file  Intimate Partner Violence: Not At Risk (06/02/2024)   Humiliation, Afraid, Rape, and Kick questionnaire    Fear of Current or Ex-Partner: No    Emotionally Abused: No     Physically Abused: No    Sexually Abused: No    FAMILY HISTORY: Family History  Problem Relation Age of Onset   Diabetes Father    Asthma Daughter    Stroke Maternal Grandmother    Atrial fibrillation Maternal Grandmother    Heart murmur Maternal Grandmother    Cancer Maternal Grandfather        liver   Alcohol abuse Paternal Grandfather     ALLERGIES:  is allergic to penicillins.  MEDICATIONS:  Current Outpatient Medications  Medication Sig Dispense Refill   acetaminophen  (TYLENOL ) 500 MG tablet Take 1,000 mg by mouth every 6 (six) hours as needed.     cyanocobalamin (VITAMIN B12) 1000 MCG tablet Take 1,000 mcg by mouth daily. Takes gummies     FLUoxetine  (PROZAC ) 40 MG capsule Take 40 mg by mouth daily.     MULTIPLE VITAMINS/IRON PO Take 1 tablet by mouth daily.     Current Facility-Administered Medications  Medication Dose Route Frequency Provider Last Rate Last Admin   cefTRIAXone  (ROCEPHIN ) injection 250 mg  250 mg Intramuscular Once Copland, Alicia B, PA-C       medroxyPROGESTERone  (DEPO-PROVERA ) injection 150 mg  150 mg Intramuscular Once Stuart Vernell Norris, [place]         PHYSICAL EXAMINATION:   Vitals:   06/02/24 1406  BP: (!) 111/57  Pulse: 84  Resp: 18  Temp: 97.8 F (36.6 C)  SpO2: 100%   Filed Weights   06/02/24 1406  Weight: 272 lb 4.8 oz (123.5 kg)    Physical Exam Vitals and nursing note reviewed.  HENT:     Head: Normocephalic and atraumatic.     Mouth/Throat:     Pharynx: Oropharynx is clear.  Eyes:     Extraocular Movements: Extraocular movements intact.     Pupils: Pupils are equal, round, and reactive to light.  Cardiovascular:     Rate and Rhythm: Normal rate and regular rhythm.  Pulmonary:     Comments: Decreased breath sounds bilaterally.  Abdominal:     Palpations: Abdomen is soft.  Musculoskeletal:        General: Normal range of motion.     Cervical back: Normal range of motion.  Skin:    General: Skin is warm.   Neurological:     General: No focal deficit present.     Mental Status: She is alert and oriented to person, place, and time.  Psychiatric:        Behavior: Behavior normal.        Judgment: Judgment normal.      LABORATORY DATA:  I have reviewed the data as listed Lab Results  Component Value Date   WBC 6.7 12/15/2021   HGB 10.2 (L) 12/15/2021   HCT 34.7 (L) 12/15/2021   MCV 75.4 (L) 12/15/2021   PLT 332 12/15/2021   No results for input(s): NA, K, CL, CO2, GLUCOSE, BUN, CREATININE, CALCIUM, GFRNONAA, GFRAA, PROT, ALBUMIN, AST, ALT, ALKPHOS, BILITOT, BILIDIR, IBILI in the last 8760 hours.   No  results found.  ASSESSMENT & PLAN:   Symptomatic anemia # Anemia- Hb-9.8; ferritin- 4 [JULY 2025- PCP]symptomatic.  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    # Not compliant with MVT- #  Discussed regarding IV iron infusion/Venofer. Discussed the potential acute infusion reactions with IV iron; which are quite rare.  Patient understands the risk; will proceed with infusions.   # b12 def on oral supplements-   # Dyspepsia-chronic intermittent-discussed importance of EGD for further workup.  However patient is not too concerned at this time.  If worse she will consider GI evaluation.  Thank you Dr.Miller-  for allowing me to participate in the care of your pleasant patient. Please do not hesitate to contact me with questions or concerns in the interim.  Urine pregnancy test- declined # DISPOSITION: # NO labs today # weekly venofer x 4  # follow up 3 months- MD; labs- cbc/bmp;iron studies; ferritin; vit D ; b12 - possible venofer- Dr.B    All questions were answered. The patient knows to call the clinic with any problems, questions or concerns.    Cindy JONELLE Joe, MD 06/02/2024 2:54 PM

## 2024-06-02 NOTE — Assessment & Plan Note (Addendum)
#   Anemia- Hb-9.8; ferritin- 4 [JULY 2025- PCP]symptomatic.  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    # Not compliant with MVT- #  Discussed regarding IV iron infusion/Venofer. Discussed the potential acute infusion reactions with IV iron; which are quite rare.  Patient understands the risk; will proceed with infusions.   # b12 def on oral supplements-   # Dyspepsia-chronic intermittent-discussed importance of EGD for further workup.  However patient is not too concerned at this time.  If worse she will consider GI evaluation.  Thank you Dr.Miller-  for allowing me to participate in the care of your pleasant patient. Please do not hesitate to contact me with questions or concerns in the interim.  Urine pregnancy test- declined # DISPOSITION: # NO labs today # weekly venofer x 4  # follow up 3 months- MD; labs- cbc/bmp;iron studies; ferritin; vit D ; b12 - possible venofer- Dr.B

## 2024-06-02 NOTE — Progress Notes (Signed)
 Taking Vit B12 gummies and MVI's which seem to help. Lightheadedness. Headaches. Dyspnea. HR increases. No blood in stools. Weight loss surgery By Epifanio Collar on Y.(2016). Appetite fluctuates.

## 2024-06-07 ENCOUNTER — Inpatient Hospital Stay

## 2024-06-07 VITALS — BP 121/75 | HR 72 | Temp 97.5°F | Resp 18

## 2024-06-07 DIAGNOSIS — D509 Iron deficiency anemia, unspecified: Secondary | ICD-10-CM | POA: Diagnosis not present

## 2024-06-07 DIAGNOSIS — D649 Anemia, unspecified: Secondary | ICD-10-CM

## 2024-06-07 MED ORDER — IRON SUCROSE 20 MG/ML IV SOLN
200.0000 mg | Freq: Once | INTRAVENOUS | Status: AC
  Administered 2024-06-07 (×2): 200 mg via INTRAVENOUS
  Filled 2024-06-07: qty 10

## 2024-06-07 NOTE — Patient Instructions (Signed)

## 2024-06-10 ENCOUNTER — Ambulatory Visit

## 2024-06-15 ENCOUNTER — Inpatient Hospital Stay

## 2024-06-15 VITALS — BP 103/51 | HR 74 | Temp 96.6°F | Resp 17

## 2024-06-15 DIAGNOSIS — D509 Iron deficiency anemia, unspecified: Secondary | ICD-10-CM | POA: Diagnosis not present

## 2024-06-15 DIAGNOSIS — D649 Anemia, unspecified: Secondary | ICD-10-CM

## 2024-06-15 MED ORDER — SODIUM CHLORIDE 0.9% FLUSH
10.0000 mL | Freq: Once | INTRAVENOUS | Status: AC | PRN
Start: 2024-06-15 — End: 2024-06-15
  Administered 2024-06-15: 10 mL
  Filled 2024-06-15: qty 10

## 2024-06-15 MED ORDER — IRON SUCROSE 20 MG/ML IV SOLN
200.0000 mg | Freq: Once | INTRAVENOUS | Status: AC
  Administered 2024-06-15: 200 mg via INTRAVENOUS
  Filled 2024-06-15: qty 10

## 2024-06-15 NOTE — Progress Notes (Signed)
 Patient tolerated Venofer  infusion well. Explained recommendation of 30 min post monitoring. Patient refused to wait post monitoring. Educated on what signs to watch for & to call with any concerns. No questions, discharged. Stable

## 2024-06-15 NOTE — Patient Instructions (Signed)

## 2024-06-22 ENCOUNTER — Inpatient Hospital Stay

## 2024-06-22 VITALS — BP 116/66 | HR 68 | Temp 96.5°F | Resp 18

## 2024-06-22 DIAGNOSIS — D509 Iron deficiency anemia, unspecified: Secondary | ICD-10-CM | POA: Diagnosis not present

## 2024-06-22 DIAGNOSIS — D649 Anemia, unspecified: Secondary | ICD-10-CM

## 2024-06-22 MED ORDER — IRON SUCROSE 20 MG/ML IV SOLN
200.0000 mg | Freq: Once | INTRAVENOUS | Status: AC
  Administered 2024-06-22: 200 mg via INTRAVENOUS
  Filled 2024-06-22: qty 10

## 2024-06-22 MED ORDER — SODIUM CHLORIDE 0.9% FLUSH
10.0000 mL | Freq: Once | INTRAVENOUS | Status: AC | PRN
  Administered 2024-06-22: 10 mL
  Filled 2024-06-22: qty 10

## 2024-07-01 ENCOUNTER — Inpatient Hospital Stay: Attending: Oncology

## 2024-07-01 VITALS — BP 120/73 | HR 72 | Temp 96.8°F | Resp 18

## 2024-07-01 DIAGNOSIS — D509 Iron deficiency anemia, unspecified: Secondary | ICD-10-CM | POA: Insufficient documentation

## 2024-07-01 DIAGNOSIS — D649 Anemia, unspecified: Secondary | ICD-10-CM

## 2024-07-01 MED ORDER — IRON SUCROSE 20 MG/ML IV SOLN
200.0000 mg | Freq: Once | INTRAVENOUS | Status: AC
  Administered 2024-07-01: 200 mg via INTRAVENOUS
  Filled 2024-07-01: qty 10

## 2024-07-01 NOTE — Patient Instructions (Signed)

## 2024-09-02 ENCOUNTER — Encounter: Payer: Self-pay | Admitting: Internal Medicine

## 2024-09-02 ENCOUNTER — Inpatient Hospital Stay

## 2024-09-02 ENCOUNTER — Inpatient Hospital Stay: Attending: Oncology

## 2024-09-02 ENCOUNTER — Inpatient Hospital Stay (HOSPITAL_BASED_OUTPATIENT_CLINIC_OR_DEPARTMENT_OTHER): Admitting: Internal Medicine

## 2024-09-02 VITALS — BP 113/75 | HR 55

## 2024-09-02 VITALS — BP 117/74 | HR 59 | Temp 96.4°F | Resp 18 | Ht 66.5 in | Wt 267.5 lb

## 2024-09-02 DIAGNOSIS — D649 Anemia, unspecified: Secondary | ICD-10-CM

## 2024-09-02 DIAGNOSIS — D509 Iron deficiency anemia, unspecified: Secondary | ICD-10-CM | POA: Diagnosis present

## 2024-09-02 LAB — CBC WITH DIFFERENTIAL (CANCER CENTER ONLY)
Abs Immature Granulocytes: 0.01 K/uL (ref 0.00–0.07)
Basophils Absolute: 0 K/uL (ref 0.0–0.1)
Basophils Relative: 1 %
Eosinophils Absolute: 0.1 K/uL (ref 0.0–0.5)
Eosinophils Relative: 3 %
HCT: 42.2 % (ref 36.0–46.0)
Hemoglobin: 14 g/dL (ref 12.0–15.0)
Immature Granulocytes: 0 %
Lymphocytes Relative: 27 %
Lymphs Abs: 1.4 K/uL (ref 0.7–4.0)
MCH: 29.5 pg (ref 26.0–34.0)
MCHC: 33.2 g/dL (ref 30.0–36.0)
MCV: 89 fL (ref 80.0–100.0)
Monocytes Absolute: 0.3 K/uL (ref 0.1–1.0)
Monocytes Relative: 6 %
Neutro Abs: 3.1 K/uL (ref 1.7–7.7)
Neutrophils Relative %: 63 %
Platelet Count: 250 K/uL (ref 150–400)
RBC: 4.74 MIL/uL (ref 3.87–5.11)
RDW: 14.7 % (ref 11.5–15.5)
WBC Count: 5 K/uL (ref 4.0–10.5)
nRBC: 0 % (ref 0.0–0.2)

## 2024-09-02 LAB — BASIC METABOLIC PANEL - CANCER CENTER ONLY
Anion gap: 9 (ref 5–15)
BUN: 10 mg/dL (ref 6–20)
CO2: 25 mmol/L (ref 22–32)
Calcium: 8.7 mg/dL — ABNORMAL LOW (ref 8.9–10.3)
Chloride: 103 mmol/L (ref 98–111)
Creatinine: 0.88 mg/dL (ref 0.44–1.00)
GFR, Estimated: >60 mL/min (ref >60)
Glucose, Bld: 86 mg/dL (ref 70–99)
Potassium: 4.6 mmol/L (ref 3.5–5.1)
Sodium: 137 mmol/L (ref 135–145)

## 2024-09-02 LAB — VITAMIN D 25 HYDROXY (VIT D DEFICIENCY, FRACTURES): Vit D, 25-Hydroxy: 17.68 ng/mL — ABNORMAL LOW (ref 30–100)

## 2024-09-02 LAB — FERRITIN: Ferritin: 40 ng/mL (ref 11–307)

## 2024-09-02 LAB — VITAMIN B12: Vitamin B-12: 456 pg/mL (ref 180–914)

## 2024-09-02 LAB — IRON AND TIBC
Iron: 74 ug/dL (ref 28–170)
Saturation Ratios: 16 % (ref 10.4–31.8)
TIBC: 468 ug/dL — ABNORMAL HIGH (ref 250–450)
UIBC: 394 ug/dL

## 2024-09-02 MED ORDER — SODIUM CHLORIDE 0.9% FLUSH
10.0000 mL | Freq: Once | INTRAVENOUS | Status: AC | PRN
  Administered 2024-09-02: 10 mL
  Filled 2024-09-02: qty 10

## 2024-09-02 MED ORDER — IRON SUCROSE 20 MG/ML IV SOLN
200.0000 mg | Freq: Once | INTRAVENOUS | Status: AC
  Administered 2024-09-02: 200 mg via INTRAVENOUS
  Filled 2024-09-02: qty 10

## 2024-09-02 NOTE — Patient Instructions (Signed)
Recommend barimelt+ iron [over-the-counter medication/can buy online].  2 a day- under the tongue. These pills need to dissolve under the tongue/and should be absorbed into your bloodstream.

## 2024-09-02 NOTE — Assessment & Plan Note (Addendum)
#   Anemia- Hb-9.8; ferritin- 4 [JULY 2025- PCP]symptomatic.  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  Recommend barimelt+ iron  [over-the-counter medication/can buy online].  2 a day- under the tongue. These pills need to dissolve under the tongue/and should be absorbed into your bloodstream.   # s/p venofer - Today- Hb 14- proceed with mainatance Venofer .   # b12 def on oral supplements-; B12 today pending.   # Low ca: recommend ca+vitD   Urine pregnancy test- declined  # DISPOSITION:  # venfoer  # follow up 6 months- MD; labs- cbc/bmp;iron  studies; ferritin; vit D ; b12 - possible venofer - Dr.B

## 2024-09-02 NOTE — Progress Notes (Signed)
 [facility] CONSULT NOTE  Patient Care Team: Daphane Rosella, NP (Inactive) as PCP - General (Nurse Practitioner) Daphane Rosella, NP (Inactive) as PCP - Family Medicine (Nurse Practitioner) Rennie Cindy SAUNDERS, MD as Consulting Physician (Oncology)  CHIEF COMPLAINTS/PURPOSE OF CONSULTATION: ANEMIA   HEMATOLOGY HISTORY  # ANEMIA[Hb; MCV-platelets- WBC; Iron  sat; ferritin;  GFR- CT/US - ;   HISTORY OF PRESENTING ILLNESS: Patient ambulating-independently. Alone   Discussed the use of AI scribe software for clinical note transcription with the patient, who gave verbal consent to proceed.  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  She recently underwent iron  infusions, which have improved her energy levels. Her hemoglobin levels have increased from 10 to 14 after the iron  infusions.  Her calcium levels are slightly low, with a recent measurement of 8.7. She was previously taking a multivitamin but is unsure if it contained vitamin D . Her vitamin D  levels were noted to be low in 2017, though they have not been checked recently.  Her B12 levels were checked by her family doctor in July and were normal at that time. She continues to take B12 supplements in the form of gummies.        Review of Systems  Constitutional:  Positive for malaise/fatigue. Negative for chills, diaphoresis, fever and weight loss.  HENT:  Negative for nosebleeds and sore throat.   Eyes:  Negative for double vision.  Respiratory:  Negative for cough, hemoptysis, sputum production, shortness of breath and wheezing.   Cardiovascular:  Negative for chest pain, palpitations, orthopnea and leg swelling.  Gastrointestinal:  Negative for abdominal pain, blood in stool, constipation, diarrhea, heartburn, melena, nausea and vomiting.  Genitourinary:  Negative for dysuria, frequency and urgency.  Musculoskeletal:  Negative for  back pain and joint pain.  Skin: Negative.  Negative for itching and rash.  Neurological:  Negative for dizziness, tingling, focal weakness, weakness and headaches.  Endo/Heme/Allergies:  Does not bruise/bleed easily.  Psychiatric/Behavioral:  Negative for depression. The patient is not nervous/anxious and does not have insomnia.      MEDICAL HISTORY:  Past Medical History:  Diagnosis Date   Abnormal glucose    Anxiety    Depression    Obesity    Seizures (HCC)    stress seizure   Sleep apnea     SURGICAL HISTORY: Past Surgical History:  Procedure Laterality Date   GASTRIC ROUX-EN-Y N/A 10/03/2015   Procedure: LAPAROSCOPIC ROUX-EN-Y GASTRIC;  Surgeon: Thom CHRISTELLA Pin, MD;  Location: [facility] ORS;  Service: General;  Laterality: N/A;    SOCIAL HISTORY: Social History   Socioeconomic History   Marital status: Married    Spouse name: Not on file   Number of children: Not on file   Years of education: Not on file   Highest education level: Not on file  Occupational History   Not on file  Tobacco Use   Smoking status: Never   Smokeless tobacco: Never  Vaping Use   Vaping status: Never Used  Substance and Sexual Activity   Alcohol use: No   Drug use: No   Sexual activity: Yes    Birth control/protection: None  Other Topics Concern   Not on file  Social History Narrative   Not on file   Social Drivers of Health   Financial Resource Strain: Low Risk  (02/27/2024)   Received from [facility]   Overall Financial Resource Strain (CARDIA)  Difficulty of Paying Living Expenses: Not hard at all  Food Insecurity: No Food Insecurity (06/02/2024)   Hunger Vital Sign    Worried About Running Out of Food in the Last Year: Never true    Ran Out of Food in the Last Year: Never true  Transportation Needs: No Transportation Needs (06/02/2024)   PRAPARE - Administrator, Civil Service (Medical): No    Lack of Transportation (Non-Medical): No  Physical  Activity: Inactive (10/13/2017)   Exercise Vital Sign    Days of Exercise per Week: 0 days    Minutes of Exercise per Session: 0 min  Stress: No Stress Concern Present (10/13/2017)   [employer] of Occupational Health - Occupational Stress Questionnaire    Feeling of Stress : Not at all  Social Connections: Not on file  Intimate Partner Violence: Not At Risk (06/02/2024)   Humiliation, Afraid, Rape, and Kick questionnaire    Fear of Current or Ex-Partner: No    Emotionally Abused: No    Physically Abused: No    Sexually Abused: No    FAMILY HISTORY: Family History  Problem Relation Age of Onset   Diabetes Father    Asthma Daughter    Stroke Maternal Grandmother    Atrial fibrillation Maternal Grandmother    Heart murmur Maternal Grandmother    Cancer Maternal Grandfather        liver   Alcohol abuse Paternal Grandfather     ALLERGIES:  is allergic to penicillins.  MEDICATIONS:  Current Outpatient Medications  Medication Sig Dispense Refill   acetaminophen  (TYLENOL ) 500 MG tablet Take 1,000 mg by mouth every 6 (six) hours as needed.     cyanocobalamin (VITAMIN B12) 1000 MCG tablet Take 1,000 mcg by mouth daily. Takes gummies     FLUoxetine  (PROZAC ) 40 MG capsule Take 40 mg by mouth daily.     MULTIPLE VITAMINS/IRON  PO Take 1 tablet by mouth daily.     Current Facility-Administered Medications  Medication Dose Route Frequency Provider Last Rate Last Admin   cefTRIAXone  (ROCEPHIN ) injection 250 mg  250 mg Intramuscular Once Copland, Alicia B, PA-C       medroxyPROGESTERone  (DEPO-PROVERA ) injection 150 mg  150 mg Intramuscular Once Lane, Rachel Elizabeth, PA-C       Facility-Administered Medications Ordered in Other Visits  Medication Dose Route Frequency Provider Last Rate Last Admin   iron  sucrose (VENOFER ) injection 200 mg  200 mg Intravenous Once Axavier Pressley R, MD       sodium chloride  flush (NS) 0.9 % injection 10 mL  10 mL Intracatheter Once PRN  Jamell Laymon R, MD         PHYSICAL EXAMINATION:   Vitals:   09/02/24 0924  BP: 117/74  Pulse: (!) 59  Resp: 18  Temp: (!) 96.4 F (35.8 C)  SpO2: 100%   Filed Weights   09/02/24 0924  Weight: 267 lb 8 oz (121.3 kg)    Physical Exam Vitals and nursing note reviewed.  HENT:     Head: Normocephalic and atraumatic.     Mouth/Throat:     Pharynx: Oropharynx is clear.  Eyes:     Extraocular Movements: Extraocular movements intact.     Pupils: Pupils are equal, round, and reactive to light.  Cardiovascular:     Rate and Rhythm: Normal rate and regular rhythm.  Pulmonary:     Comments: Decreased breath sounds bilaterally.  Abdominal:     Palpations: Abdomen is soft.  Musculoskeletal:  General: Normal range of motion.     Cervical back: Normal range of motion.  Skin:    General: Skin is warm.  Neurological:     General: No focal deficit present.     Mental Status: She is alert and oriented to person, place, and time.  Psychiatric:        Behavior: Behavior normal.        Judgment: Judgment normal.      LABORATORY DATA:  I have reviewed the data as listed Lab Results  Component Value Date   WBC 5.0 09/02/2024   HGB 14.0 09/02/2024   HCT 42.2 09/02/2024   MCV 89.0 09/02/2024   PLT 250 09/02/2024   Recent Labs    09/02/24 0930  NA 137  K 4.6  CL 103  CO2 25  GLUCOSE 86  BUN 10  CREATININE 0.88  CALCIUM 8.7*  GFRNONAA >60     No results found.  ASSESSMENT & PLAN:   Symptomatic anemia # Anemia- Hb-9.8; ferritin- 4 [JULY 2025- PCP]symptomatic.  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  Recommend barimelt+ iron  [over-the-counter medication/can buy online].  2 a day- under the tongue. These pills need to dissolve under the tongue/and should be absorbed into your bloodstream.   # s/p venofer - Today- Hb 14- proceed with mainatance Venofer .   # b12 def on oral supplements-; B12 today  pending.   # Low ca: recommend ca+vitD   Urine pregnancy test- declined  # DISPOSITION:  # venfoer  # follow up 6 months- MD; labs- cbc/bmp;iron  studies; ferritin; vit D ; b12 - possible venofer - Dr.B     All questions were answered. The patient knows to call the clinic with any problems, questions or concerns.    Cindy JONELLE Joe, MD 09/02/2024 10:41 AM

## 2024-09-02 NOTE — Progress Notes (Signed)
 No concerns today

## 2025-03-02 ENCOUNTER — Inpatient Hospital Stay

## 2025-03-02 ENCOUNTER — Inpatient Hospital Stay: Admitting: Internal Medicine
